# Patient Record
Sex: Female | Born: 1970 | Race: White | Hispanic: No | Marital: Married | Smoking: Never smoker
Health system: Southern US, Community
[De-identification: ages and names within clinical notes are randomized; demographics above are authoritative.]

## PROBLEM LIST (undated history)

## (undated) DIAGNOSIS — F419 Anxiety disorder, unspecified: Secondary | ICD-10-CM

## (undated) DIAGNOSIS — F329 Major depressive disorder, single episode, unspecified: Secondary | ICD-10-CM

## (undated) DIAGNOSIS — F319 Bipolar disorder, unspecified: Secondary | ICD-10-CM

## (undated) DIAGNOSIS — F32A Depression, unspecified: Secondary | ICD-10-CM

## (undated) DIAGNOSIS — F909 Attention-deficit hyperactivity disorder, unspecified type: Secondary | ICD-10-CM

## (undated) HISTORY — PX: HERNIA REPAIR: SHX51

## (undated) HISTORY — PX: KNEE ARTHROSCOPY: SHX127

## (undated) HISTORY — PX: WRIST SURGERY: SHX841

## (undated) HISTORY — DX: Bipolar disorder, unspecified: F31.9

## (undated) HISTORY — DX: Attention-deficit hyperactivity disorder, unspecified type: F90.9

## (undated) HISTORY — PX: KIDNEY SURGERY: SHX687

## (undated) HISTORY — DX: Anxiety disorder, unspecified: F41.9

## (undated) HISTORY — DX: Depression, unspecified: F32.A

## (undated) HISTORY — PX: ABDOMINAL HYSTERECTOMY: SHX81

## (undated) HISTORY — DX: Major depressive disorder, single episode, unspecified: F32.9

---

## 1998-05-15 ENCOUNTER — Other Ambulatory Visit: Admission: RE | Admit: 1998-05-15 | Discharge: 1998-05-15 | Payer: Self-pay | Admitting: Obstetrics and Gynecology

## 2000-08-08 ENCOUNTER — Other Ambulatory Visit: Admission: RE | Admit: 2000-08-08 | Discharge: 2000-08-08 | Payer: Self-pay | Admitting: Obstetrics and Gynecology

## 2005-06-18 ENCOUNTER — Other Ambulatory Visit: Payer: Self-pay

## 2005-06-18 ENCOUNTER — Emergency Department: Payer: Self-pay | Admitting: Emergency Medicine

## 2006-02-09 ENCOUNTER — Emergency Department: Payer: Self-pay | Admitting: Emergency Medicine

## 2006-12-22 ENCOUNTER — Emergency Department: Payer: Self-pay | Admitting: Emergency Medicine

## 2007-01-02 ENCOUNTER — Ambulatory Visit: Payer: Self-pay | Admitting: Urology

## 2007-01-16 ENCOUNTER — Ambulatory Visit: Payer: Self-pay | Admitting: Pain Medicine

## 2007-01-23 ENCOUNTER — Ambulatory Visit: Payer: Self-pay | Admitting: Pain Medicine

## 2007-01-24 ENCOUNTER — Ambulatory Visit: Payer: Self-pay | Admitting: Urology

## 2007-02-15 ENCOUNTER — Ambulatory Visit: Payer: Self-pay | Admitting: Pain Medicine

## 2007-02-23 ENCOUNTER — Ambulatory Visit: Payer: Self-pay | Admitting: Pain Medicine

## 2007-03-08 ENCOUNTER — Ambulatory Visit: Payer: Self-pay | Admitting: Physician Assistant

## 2007-03-14 ENCOUNTER — Ambulatory Visit: Payer: Self-pay | Admitting: Pain Medicine

## 2007-03-28 ENCOUNTER — Ambulatory Visit: Payer: Self-pay | Admitting: Pain Medicine

## 2007-04-12 ENCOUNTER — Ambulatory Visit: Payer: Self-pay | Admitting: Pain Medicine

## 2007-04-24 ENCOUNTER — Ambulatory Visit: Payer: Self-pay | Admitting: Physician Assistant

## 2007-05-17 ENCOUNTER — Encounter: Payer: Self-pay | Admitting: Pain Medicine

## 2007-05-23 ENCOUNTER — Ambulatory Visit: Payer: Self-pay | Admitting: Physician Assistant

## 2007-06-06 ENCOUNTER — Ambulatory Visit: Payer: Self-pay | Admitting: Physician Assistant

## 2007-06-16 ENCOUNTER — Encounter: Payer: Self-pay | Admitting: Pain Medicine

## 2007-07-17 ENCOUNTER — Encounter: Payer: Self-pay | Admitting: Pain Medicine

## 2007-08-09 ENCOUNTER — Ambulatory Visit: Payer: Self-pay | Admitting: Physician Assistant

## 2007-08-10 ENCOUNTER — Ambulatory Visit: Payer: Self-pay | Admitting: Pain Medicine

## 2007-08-21 ENCOUNTER — Other Ambulatory Visit: Payer: Self-pay

## 2007-08-21 ENCOUNTER — Emergency Department: Payer: Self-pay | Admitting: Emergency Medicine

## 2007-08-22 ENCOUNTER — Ambulatory Visit: Payer: Self-pay | Admitting: Emergency Medicine

## 2007-11-28 IMAGING — NM NM KIDNEY FUNCTION AND FLOW
2 series · 12 of 12 positions shown · non-contrast
Comparison: none

REASON FOR EXAM: Known hydronephrosis, checking kidney percentages only
COMMENTS:

[Series 1: renal (results) · 7.79mm/px · 6 of 60 frames shown]
[frame 6/60]
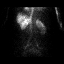
[frame 16/60]
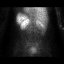
[frame 26/60]
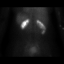
[frame 36/60]
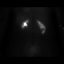
[frame 46/60]
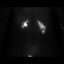
[frame 56/60]
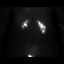

[Series 1: renal · 7.79mm/px · 6 of 60 frames shown]
[frame 6/60]
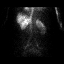
[frame 16/60]
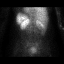
[frame 26/60]
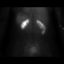
[frame 36/60]
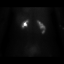
[frame 46/60]
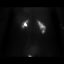
[frame 56/60]
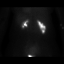

[12 of 12 positions shown; findings below may reference images not displayed]

PROCEDURE:     NM  - NM KIDNEY SCAN W  VASCULAR FLOW  - January 02, 2007  [DATE]

RESULT:     Following intravenous administration of 14.6 mCi of Technetium
99m MTPA, the renogram shows progressive accumulation of tracer activity in
the RIGHT kidney compatible with the patient's known history of
hydronephrosis.

Split renal function determination shows 53.5% function on the RIGHT and
46.5% function on the LEFT.
IMPRESSION: Please see above.

## 2008-01-22 ENCOUNTER — Ambulatory Visit: Payer: Self-pay | Admitting: Pain Medicine

## 2008-01-25 ENCOUNTER — Ambulatory Visit: Payer: Self-pay | Admitting: Pain Medicine

## 2008-02-07 ENCOUNTER — Ambulatory Visit: Payer: Self-pay | Admitting: Pain Medicine

## 2008-02-20 ENCOUNTER — Ambulatory Visit: Payer: Self-pay | Admitting: Pain Medicine

## 2008-05-27 ENCOUNTER — Inpatient Hospital Stay: Payer: Self-pay | Admitting: Unknown Physician Specialty

## 2008-09-02 ENCOUNTER — Ambulatory Visit: Payer: Self-pay | Admitting: Pain Medicine

## 2008-09-05 ENCOUNTER — Ambulatory Visit: Payer: Self-pay | Admitting: Pain Medicine

## 2008-09-30 ENCOUNTER — Ambulatory Visit: Payer: Self-pay | Admitting: Physician Assistant

## 2008-10-01 ENCOUNTER — Ambulatory Visit: Payer: Self-pay | Admitting: Pain Medicine

## 2008-10-15 ENCOUNTER — Ambulatory Visit: Payer: Self-pay | Admitting: Physician Assistant

## 2008-10-22 ENCOUNTER — Ambulatory Visit: Payer: Self-pay | Admitting: Pain Medicine

## 2008-11-27 ENCOUNTER — Ambulatory Visit: Payer: Self-pay | Admitting: Physician Assistant

## 2008-12-03 ENCOUNTER — Ambulatory Visit: Payer: Self-pay | Admitting: Pain Medicine

## 2008-12-31 ENCOUNTER — Emergency Department: Payer: Self-pay | Admitting: Emergency Medicine

## 2009-03-10 ENCOUNTER — Ambulatory Visit: Payer: Self-pay | Admitting: Physician Assistant

## 2009-03-20 ENCOUNTER — Ambulatory Visit: Payer: Self-pay | Admitting: Pain Medicine

## 2009-04-22 ENCOUNTER — Ambulatory Visit: Payer: Self-pay | Admitting: Physician Assistant

## 2009-05-09 ENCOUNTER — Ambulatory Visit: Payer: Self-pay | Admitting: Pain Medicine

## 2009-06-10 ENCOUNTER — Ambulatory Visit: Payer: Self-pay | Admitting: Physician Assistant

## 2009-07-01 ENCOUNTER — Ambulatory Visit: Payer: Self-pay | Admitting: Pain Medicine

## 2009-07-16 ENCOUNTER — Ambulatory Visit: Payer: Self-pay | Admitting: Physician Assistant

## 2009-09-11 ENCOUNTER — Ambulatory Visit: Payer: Self-pay | Admitting: Physician Assistant

## 2009-09-15 ENCOUNTER — Emergency Department: Payer: Self-pay | Admitting: Emergency Medicine

## 2009-10-08 ENCOUNTER — Ambulatory Visit: Payer: Self-pay | Admitting: Physician Assistant

## 2009-12-02 ENCOUNTER — Ambulatory Visit: Payer: Self-pay | Admitting: Pain Medicine

## 2009-12-24 ENCOUNTER — Ambulatory Visit: Payer: Self-pay | Admitting: Pain Medicine

## 2010-12-23 ENCOUNTER — Ambulatory Visit: Payer: Self-pay | Admitting: Pain Medicine

## 2010-12-31 ENCOUNTER — Ambulatory Visit: Payer: Self-pay | Admitting: Pain Medicine

## 2011-05-25 DIAGNOSIS — S5290XA Unspecified fracture of unspecified forearm, initial encounter for closed fracture: Secondary | ICD-10-CM | POA: Insufficient documentation

## 2011-06-14 DIAGNOSIS — D4819 Other specified neoplasm of uncertain behavior of connective and other soft tissue: Secondary | ICD-10-CM | POA: Insufficient documentation

## 2011-06-14 DIAGNOSIS — D481 Neoplasm of uncertain behavior of connective and other soft tissue: Secondary | ICD-10-CM | POA: Insufficient documentation

## 2011-06-17 DIAGNOSIS — C44701 Unspecified malignant neoplasm of skin of unspecified lower limb, including hip: Secondary | ICD-10-CM | POA: Insufficient documentation

## 2012-03-13 ENCOUNTER — Emergency Department: Payer: Self-pay | Admitting: Emergency Medicine

## 2012-03-13 LAB — URINALYSIS, COMPLETE
Glucose,UR: NEGATIVE mg/dL (ref 0–75)
Nitrite: NEGATIVE
Ph: 5 (ref 4.5–8.0)
Specific Gravity: 1.023 (ref 1.003–1.030)

## 2012-03-13 LAB — CBC
HCT: 39.5 % (ref 35.0–47.0)
MCH: 31.5 pg (ref 26.0–34.0)
MCHC: 34.1 g/dL (ref 32.0–36.0)
MCV: 92 fL (ref 80–100)
Platelet: 263 10*3/uL (ref 150–440)
RDW: 12.8 % (ref 11.5–14.5)
WBC: 5.5 10*3/uL (ref 3.6–11.0)

## 2012-03-13 LAB — BASIC METABOLIC PANEL
Anion Gap: 6 — ABNORMAL LOW (ref 7–16)
BUN: 8 mg/dL (ref 7–18)
Calcium, Total: 8.7 mg/dL (ref 8.5–10.1)
Chloride: 108 mmol/L — ABNORMAL HIGH (ref 98–107)
Co2: 27 mmol/L (ref 21–32)
EGFR (Non-African Amer.): >60
Glucose: 98 mg/dL (ref 65–99)
Osmolality: 280 (ref 275–301)
Sodium: 141 mmol/L (ref 136–145)

## 2012-03-15 LAB — URINE CULTURE

## 2012-03-17 DIAGNOSIS — Z85828 Personal history of other malignant neoplasm of skin: Secondary | ICD-10-CM | POA: Insufficient documentation

## 2013-02-27 DIAGNOSIS — Z01419 Encounter for gynecological examination (general) (routine) without abnormal findings: Secondary | ICD-10-CM | POA: Insufficient documentation

## 2013-03-01 DIAGNOSIS — N939 Abnormal uterine and vaginal bleeding, unspecified: Secondary | ICD-10-CM | POA: Insufficient documentation

## 2013-03-01 DIAGNOSIS — R102 Pelvic and perineal pain: Secondary | ICD-10-CM | POA: Insufficient documentation

## 2013-03-28 DIAGNOSIS — F419 Anxiety disorder, unspecified: Secondary | ICD-10-CM | POA: Insufficient documentation

## 2013-07-05 ENCOUNTER — Ambulatory Visit: Payer: Self-pay | Admitting: General Practice

## 2014-07-02 DIAGNOSIS — F329 Major depressive disorder, single episode, unspecified: Secondary | ICD-10-CM | POA: Insufficient documentation

## 2014-07-02 DIAGNOSIS — Z9889 Other specified postprocedural states: Secondary | ICD-10-CM

## 2014-07-02 DIAGNOSIS — F32A Depression, unspecified: Secondary | ICD-10-CM | POA: Insufficient documentation

## 2014-07-02 DIAGNOSIS — G8921 Chronic pain due to trauma: Secondary | ICD-10-CM | POA: Insufficient documentation

## 2014-07-02 DIAGNOSIS — F319 Bipolar disorder, unspecified: Secondary | ICD-10-CM | POA: Insufficient documentation

## 2014-07-02 DIAGNOSIS — R112 Nausea with vomiting, unspecified: Secondary | ICD-10-CM | POA: Insufficient documentation

## 2014-07-02 DIAGNOSIS — F1911 Other psychoactive substance abuse, in remission: Secondary | ICD-10-CM | POA: Insufficient documentation

## 2014-07-22 DIAGNOSIS — K449 Diaphragmatic hernia without obstruction or gangrene: Secondary | ICD-10-CM | POA: Insufficient documentation

## 2015-01-15 DIAGNOSIS — M5136 Other intervertebral disc degeneration, lumbar region: Secondary | ICD-10-CM | POA: Insufficient documentation

## 2015-01-15 DIAGNOSIS — K911 Postgastric surgery syndromes: Secondary | ICD-10-CM | POA: Insufficient documentation

## 2015-01-25 ENCOUNTER — Emergency Department: Payer: Self-pay | Admitting: Emergency Medicine

## 2015-04-01 DIAGNOSIS — Z9889 Other specified postprocedural states: Secondary | ICD-10-CM | POA: Insufficient documentation

## 2015-05-06 DIAGNOSIS — G4700 Insomnia, unspecified: Secondary | ICD-10-CM | POA: Insufficient documentation

## 2015-10-02 DIAGNOSIS — F411 Generalized anxiety disorder: Secondary | ICD-10-CM | POA: Insufficient documentation

## 2015-12-10 ENCOUNTER — Ambulatory Visit: Payer: Self-pay

## 2015-12-10 ENCOUNTER — Encounter (INDEPENDENT_AMBULATORY_CARE_PROVIDER_SITE_OTHER): Payer: Self-pay

## 2016-07-15 DIAGNOSIS — R110 Nausea: Secondary | ICD-10-CM | POA: Insufficient documentation

## 2017-07-12 ENCOUNTER — Other Ambulatory Visit: Payer: Self-pay | Admitting: Family Medicine

## 2017-07-12 ENCOUNTER — Ambulatory Visit
Admission: RE | Admit: 2017-07-12 | Discharge: 2017-07-12 | Disposition: A | Discharge status: Home / Self Care | Payer: BLUE CROSS/BLUE SHIELD | Source: Ambulatory Visit | Attending: Family Medicine | Admitting: Family Medicine

## 2017-07-12 DIAGNOSIS — M545 Low back pain: Secondary | ICD-10-CM | POA: Diagnosis present

## 2017-08-02 DIAGNOSIS — F902 Attention-deficit hyperactivity disorder, combined type: Secondary | ICD-10-CM | POA: Insufficient documentation

## 2017-08-24 ENCOUNTER — Other Ambulatory Visit: Payer: Self-pay | Admitting: Neurosurgery

## 2017-08-24 DIAGNOSIS — M5442 Lumbago with sciatica, left side: Secondary | ICD-10-CM

## 2017-08-30 ENCOUNTER — Ambulatory Visit
Admission: RE | Admit: 2017-08-30 | Discharge: 2017-08-30 | Disposition: A | Discharge status: Home / Self Care | Payer: BLUE CROSS/BLUE SHIELD | Source: Ambulatory Visit | Attending: Neurosurgery | Admitting: Neurosurgery

## 2017-08-30 ENCOUNTER — Ambulatory Visit: Payer: Self-pay

## 2017-08-30 DIAGNOSIS — M5127 Other intervertebral disc displacement, lumbosacral region: Secondary | ICD-10-CM | POA: Insufficient documentation

## 2017-08-30 DIAGNOSIS — M5136 Other intervertebral disc degeneration, lumbar region: Secondary | ICD-10-CM | POA: Insufficient documentation

## 2017-08-30 DIAGNOSIS — M5442 Lumbago with sciatica, left side: Secondary | ICD-10-CM | POA: Insufficient documentation

## 2018-11-28 ENCOUNTER — Encounter: Payer: Self-pay | Admitting: Psychiatry

## 2018-11-28 ENCOUNTER — Ambulatory Visit (INDEPENDENT_AMBULATORY_CARE_PROVIDER_SITE_OTHER): Payer: 59 | Admitting: Psychiatry

## 2018-11-28 ENCOUNTER — Other Ambulatory Visit: Payer: Self-pay

## 2022-06-11 NOTE — Progress Notes (Unsigned)
BH MD/PA/NP OP Progress Note  06/11/2022 10:57 AM Amy Leblanc  MRN:  [id number]  Chief Complaint: No chief complaint on file.  HPI: *** Visit Diagnosis: No diagnosis found.  Past Psychiatric History: Please see initial evaluation for full details. I have reviewed the history. No updates at this time.     Past Medical History:  Past Medical History:  Diagnosis Date   ADHD (attention deficit hyperactivity disorder)    Anxiety    Bipolar disorder ([place])    Depression     Past Surgical History:  Procedure Laterality Date   ABDOMINAL HYSTERECTOMY     HERNIA REPAIR     KIDNEY SURGERY Right    KNEE ARTHROSCOPY Bilateral    WRIST SURGERY Left     Family Psychiatric History: Please see initial evaluation for full details. I have reviewed the history. No updates at this time.     Family History:  Family History  Problem Relation Age of Onset   Anxiety disorder Mother    Depression Mother     Social History:  Social History   Socioeconomic History   Marital status: Married    Spouse name: greg   Number of children: 1   Years of education: Not on file   Highest education level: Some college, no degree  Occupational History   Not on file  Tobacco Use   Smoking status: Never   Smokeless tobacco: Never  Vaping Use   Vaping Use: Never used  Substance and Sexual Activity   Alcohol use: Not Currently   Drug use: Never   Sexual activity: Yes  Other Topics Concern   Not on file  Social History Narrative   Not on file   Social Determinants of Health   Financial Resource Strain: Low Risk  (11/28/2018)   Overall Financial Resource Strain (CARDIA)    Difficulty of Paying Living Expenses: Not hard at all  Food Insecurity: No Food Insecurity (11/28/2018)   Hunger Vital Sign    Worried About Running Out of Food in the Last Year: Never true    Ran Out of Food in the Last Year: Never true  Transportation Needs: No Transportation Needs (11/28/2018)   PRAPARE - Armed forces logistics/support/administrative officer (Medical): No    Lack of Transportation (Non-Medical): No  Physical Activity: Sufficiently Active (11/28/2018)   Exercise Vital Sign    Days of Exercise per Week: 6 days    Minutes of Exercise per Session: 60 min  Stress: Stress Concern Present (11/28/2018)   [place]    Feeling of Stress : Rather much  Social Connections: Unknown (11/28/2018)   Social Connection and Isolation Panel [NHANES]    Frequency of Communication with Friends and Family: Not on file    Frequency of Social Gatherings with Friends and Family: Not on file    Attends Religious Services: Never    Active Member of Clubs or Organizations: No    Attends Archivist Meetings: Never    Marital Status: Married    Allergies:  Allergies  Allergen Reactions   Penicillins Hives, Itching and Swelling    And itching    Ciprofloxacin Itching   Morphine Sulfate Itching   Sulfa Antibiotics Itching and Nausea And Vomiting   Tape Itching and Other (See Comments)    Welts & skin peeling off    Metabolic Disorder Labs: No results found for: "HGBA1C", "MPG" No results found for: "PROLACTIN" No results found  for: "CHOL", "TRIG", "HDL", "CHOLHDL", "VLDL", "LDLCALC" No results found for: "TSH"  Therapeutic Level Labs: No results found for: "LITHIUM" No results found for: "VALPROATE" No results found for: "CBMZ"  Current Medications: Current Outpatient Medications  Medication Sig Dispense Refill   amitriptyline (ELAVIL) 50 MG tablet      busPIRone (BUSPAR) 10 MG tablet      citalopram (CELEXA) 40 MG tablet TK 1 T PO QPM     gabapentin (NEURONTIN) 300 MG capsule Take by mouth.     LamoTRIgine 300 MG TB24 24 hour tablet      lidocaine (XYLOCAINE) 2 % solution 15 mL by Mouth route every four (4) hours as needed.     lisdexamfetamine (VYVANSE) 50 MG capsule      OLANZapine (ZYPREXA) 5 MG tablet Take by mouth.      ondansetron (ZOFRAN-ODT) 8 MG disintegrating tablet Take by mouth.     No current facility-administered medications for this visit.     Musculoskeletal: Strength & Muscle Tone: Normal Gait & Station: normal Patient leans: N/A  Psychiatric Specialty Exam: Review of Systems  There were no vitals taken for this visit.There is no height or weight on file to calculate BMI.  General Appearance: {Appearance:22683}  Eye Contact:  {BHH EYE CONTACT:22684}  Speech:  Clear and Coherent  Volume:  Normal  Mood:  {BHH MOOD:22306}  Affect:  {Affect (PAA):22687}  Thought Process:  Coherent  Orientation:  Full (Time, Place, and Person)  Thought Content: Logical   Suicidal Thoughts:  {ST/HT (PAA):22692}  Homicidal Thoughts:  {ST/HT (PAA):22692}  Memory:  Immediate;   Good  Judgement:  {Judgement (PAA):22694}  Insight:  {Insight (PAA):22695}  Psychomotor Activity:  Normal  Concentration:  Concentration: Good and Attention Span: Good  Recall:  Good  Fund of Knowledge: Good  Language: Good  Akathisia:  No  Handed:  Right  AIMS (if indicated): not done  Assets:  Communication Skills Desire for Improvement  ADL's:  Intact  Cognition: WNL  Sleep:  {BHH GOOD/FAIR/POOR:22877}   Screenings:   Assessment and Plan:  Amy Leblanc is a 51 y.o. year old female with a history of , who presents for follow up appointment for below.   Assessment  Plan   The patient demonstrates the following risk factors for suicide: Chronic risk factors for suicide include: {Chronic Risk Factors for ZOXWRUE:45409811}. Acute risk factors for suicide include: {Acute Risk Factors for BJYNWGN:56213086}. Protective factors for this patient include: {Protective Factors for Suicide VHQI:69629528}. Considering these factors, the overall suicide risk at this point appears to be {Desc; low/moderate/high:110033}. Patient {ACTION; IS/IS UXL:24401027} appropriate for outpatient follow up.        Collaboration of Care:  Collaboration of Care: {BH OP Collaboration of Care:21014065}  Patient/Guardian was advised Release of Information must be obtained prior to any record release in order to collaborate their care with an outside provider. Patient/Guardian was advised if they have not already done so to contact the registration department to sign all necessary forms in order for [place] to release information regarding their care.   Consent: Patient/Guardian gives verbal consent for treatment and assignment of benefits for services provided during this visit. Patient/Guardian expressed understanding and agreed to proceed.    Norman Clay, MD 06/11/2022, 10:57 AM

## 2022-06-14 ENCOUNTER — Ambulatory Visit (INDEPENDENT_AMBULATORY_CARE_PROVIDER_SITE_OTHER): Payer: Self-pay | Admitting: Psychiatry

## 2022-06-14 ENCOUNTER — Encounter: Payer: Self-pay | Admitting: Psychiatry

## 2022-06-14 VITALS — BP 130/88 | HR 109 | Temp 98.1°F | Ht 66.0 in | Wt 145.8 lb

## 2022-06-14 DIAGNOSIS — F3130 Bipolar disorder, current episode depressed, mild or moderate severity, unspecified: Secondary | ICD-10-CM

## 2022-06-14 DIAGNOSIS — F411 Generalized anxiety disorder: Secondary | ICD-10-CM

## 2022-06-14 MED ORDER — LORAZEPAM 2 MG PO TABS: 2.0000 mg | ORAL_TABLET | Freq: Every day | ORAL | 0 refills | Status: DC | PRN

## 2022-06-14 MED ORDER — CARIPRAZINE HCL 1.5 MG PO CAPS: 1.5000 mg | ORAL_CAPSULE | Freq: Every day | ORAL | 1 refills | Status: DC

## 2022-06-14 NOTE — Progress Notes (Signed)
Psychiatric Initial Adult Assessment   Patient Identification: Amy Leblanc MRN:  [id number] Date of Evaluation:  06/14/2022 Referral Source: Donnie Coffin, MD  Chief Complaint:   Chief Complaint  Patient presents with   Establish Care   Visit Diagnosis:    ICD-10-CM   1. Bipolar affective disorder, current episode depressed, current episode severity unspecified ([place])  F31.30     2. GAD (generalized anxiety disorder)  F41.1       History of Present Illness:   Amy Leblanc is a 51 y.o. year old female with a history of bipolar disorder, adhd, insomnia, s/p gastric surgery, s/p nephrectomy , who is referred for depression.   She states that her PCP has managing her medication, and she thought she needs help in this.  She used to be seen by Dr. Nicolasa Ducking.  She reports frustration of her care. She was claimed that she was not taking medication consistently, and was advised to hire somebody when her father was injured to take care of herself.  She received a termination letter, which caused another stress.  She states that her mother was Parkinson disease will be in hospice care.  She reports great relationship with her parents growing up.  Her mother is on morphine, and has not been able to communicate with her.  It has been awful to see her this way.  She is also concerned about her father if he were to be by himself.  She states that lot has happened over the past year, including her father having prostate cancer, having burn after fall. She had nephrectomy. She also talks about her son, who has autism.  He is not good at processing this.  Although she is "normal" at work, she isolates herself when she is not at work.  She feels overwhelmed, having crying spells and having panic attacks.  She also tends to think about worst case scenario, which she refers to the history of having septic shock when she had a Nissen surgery.   Depression-she has depressive symptoms as in PHQ-9.  She feels tired.   She denies SI.   Anxiety-she feels like she is losing her control.  She has intense anxiety with palpitation.  She has racing thoughts, and crying spells.   Mania-she reports history of decreased need for sleep up to 3 days years ago.  She was feeling energized.  She was spending money she did not have.  Although she reports buying a lottery, she states that she is doing this to feel better this time.   Medication- lamotrigine 200 mg twice a day, Buspar 30 mg twice daily (she has been on these for many years), lorazepam 1 mg daily as needed for anxiety, amitriptyline 75 mg at night (for migraine)    Household: husband, son Marital status:married Number of children: 56 yo son, mildly autistic. 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 Nicolasa Ducking Psychiatry admission: twice voluntarily. In 2000 and in 2004 for depression, "manic bout" of spending money ,  writing checks,  Previous suicide attempt: denies  Past trials of medication: lithium, Depakote, lamotrigine, olanzapine, Abilify, Geodon, risperidone (nausea) History of violence:  denies   Previous Psychotropic Medications: Yes   Substance Abuse History in the last 12 months:  No.  Consequences of Substance Abuse: Negative  Past Medical History:  Past Medical History:  Diagnosis Date   ADHD (attention deficit hyperactivity disorder)    Anxiety    Bipolar disorder ([place])    Depression      Past Surgical History:  Procedure Laterality Date   ABDOMINAL HYSTERECTOMY     HERNIA REPAIR     KIDNEY SURGERY Right    KNEE ARTHROSCOPY Bilateral    WRIST SURGERY Left     Family Psychiatric History: as below  Family History:  Family History  Problem Relation Age of Onset   Anxiety disorder Mother    Depression Mother    Schizophrenia Maternal Grandmother     Social History:   Social History   Socioeconomic History   Marital status: Married    Spouse name: greg   Number of children: 1   Years of education: Not on file   Highest education level: Some college, no degree  Occupational History   Not on file  Tobacco Use   Smoking status: Never   Smokeless tobacco: Never  Vaping Use   Vaping Use: Never used  Substance and Sexual Activity   Alcohol use: Not Currently   Drug use: Never   Sexual activity: Yes  Other Topics Concern   Not on file  Social History Narrative   Not on file   Social Determinants of Health   Financial Resource Strain: Low Risk  (11/28/2018)   Overall Financial Resource Strain (CARDIA)    Difficulty of Paying Living Expenses: Not hard at all  Food Insecurity: No Food Insecurity (11/28/2018)   Hunger Vital Sign    Worried About Running Out of Food in the Last Year: Never true    Ran Out of Food in the Last Year: Never true  Transportation Needs: No Transportation Needs (11/28/2018)   PRAPARE - Hydrologist (Medical): No    Lack of Transportation (Non-Medical): No  Physical Activity: Sufficiently Active (11/28/2018)   Exercise Vital Sign    Days of Exercise per Week: 6 days    Minutes of Exercise per Session: 60 min  Stress: Stress Concern Present (11/28/2018)   [place]    Feeling of Stress : Rather much  Social Connections: Unknown (11/28/2018)   Social Connection and Isolation Panel [NHANES]    Frequency of Communication with Friends and  Family: Not on file    Frequency of Social Gatherings with Friends and Family: Not on file    Attends Religious Services: Never    Marine scientist or Organizations: No    Attends Archivist Meetings: Never    Marital Status: Married    Additional Social History: as above  Allergies:   Allergies  Allergen Reactions   Penicillins Hives, Itching and Swelling    And itching    Ciprofloxacin Itching   Morphine Sulfate Itching   Sulfa Antibiotics Itching and Nausea And Vomiting   Tape Itching and Other (See Comments)    Welts & skin peeling off Welts & skin peeling off    Metabolic Disorder Labs: No results found for: "HGBA1C", "MPG" No results found for: "PROLACTIN" No results found for: "CHOL", "TRIG", "HDL", "  CHOLHDL", "VLDL", "LDLCALC" No results found for: "TSH"  Therapeutic Level Labs: No results found for: "LITHIUM" No results found for: "CBMZ" No results found for: "VALPROATE"  Current Medications: Current Outpatient Medications  Medication Sig Dispense Refill   amitriptyline (ELAVIL) 75 MG tablet Take by mouth.     busPIRone (BUSPAR) 30 MG tablet Take 30 mg by mouth 2 (two) times daily.     cariprazine (VRAYLAR) 1.5 MG capsule Take 1 capsule (1.5 mg total) by mouth daily. 30 capsule 1   citalopram (CELEXA) 40 MG tablet TK 1 T PO QPM     lamoTRIgine (LAMICTAL) 200 MG tablet Take 200 mg by mouth 2 (two) times daily.     LORazepam (ATIVAN) 2 MG tablet Take 1 tablet (2 mg total) by mouth daily as needed (extreme anxiety). 30 tablet 0   No current facility-administered medications for this visit.    Musculoskeletal: Strength & Muscle Tone: within normal limits Gait & Station: normal Patient leans: N/A  Psychiatric Specialty Exam: Review of Systems  Blood pressure 130/88, pulse (!) 109, temperature 98.1 F (36.7 C), temperature source Temporal, height '5\' 6"'$  (1.676 m), weight 145 lb 12.8 oz (66.1 kg).Body mass index is 23.53 kg/m.  General  Appearance: Fairly Groomed  Eye Contact:  Good  Speech:  Clear and Coherent  Volume:  Normal  Mood:  Depressed  Affect:  Appropriate, Congruent, and Restricted  Thought Process:  Coherent  Orientation:  Full (Time, Place, and Person)  Thought Content:  Logical  Suicidal Thoughts:  No  Homicidal Thoughts:  No  Memory:  Immediate;   Good  Judgement:  Good  Insight:  Good  Psychomotor Activity:  Normal  Concentration:  Concentration: Good and Attention Span: Good  Recall:  Good  Fund of Knowledge:Good  Language: Good  Akathisia:  No  Handed:  Right  AIMS (if indicated):  not done  Assets:  Communication Skills Desire for Improvement  ADL's:  Intact  Cognition: WNL  Sleep:  Poor   Screenings: GAD-7    Flowsheet Row Office Visit from 06/14/2022 in [place]  Total GAD-7 Score 21      PHQ2-9    [place] Office Visit from 06/14/2022 in [place]  PHQ-2 Total Score 4  PHQ-9 Total Score 14       Assessment and Plan:  Neyda Durango is a 51 y.o. year old female with a history of bipolar disorder, adhd, insomnia, s/p gastric surgery, s/p nephrectomy , who is referred for depression.   1. Bipolar affective disorder, current episode depressed, current episode severity unspecified ([place]) (r/o type II) 2. GAD (generalized anxiety disorder) She reports worsening in depressive symptoms and anxiety in the context of taking care of her mother who will be getting palliative care.  Other psychosocial stressors includes caring for her father, and her son with autism, who lives with the patient.  She reports good support from her husband.  Will add Vraylar to target bipolar depression.  Discussed potential metabolic side effect and EPS.  Will continue lamotrigine for bipolar disorder.  Will continue BuSpar for anxiety.  Will titrate clonazepam as needed for extreme anxiety; discussed potential risk of drowsiness and  oversedation, dependence and tolerance.   Plan (she will contact the office if she needs a refill) Start Vraylar 1.5 mg daily -[facility] Continue lamotrigine 200 mg twice a day Continue Buspar 30 mg twice daily Increase lorazepam 2 mg daily as needed for extreme anxiety - [facility] Next  appointment: 8/31 at 1 PM for 30 mins, in person   Collaboration of Care: Other N/A  Patient/Guardian was advised Release of Information must be obtained prior to any record release in order to collaborate their care with an outside provider. Patient/Guardian was advised if they have not already done so to contact the registration department to sign all necessary forms in order for [place] to release information regarding their care.   Consent: Patient/Guardian gives verbal consent for treatment and assignment of benefits for services provided during this visit. Patient/Guardian expressed understanding and agreed to proceed.   Norman Clay, MD 7/31/20234:42 PM

## 2022-06-14 NOTE — Patient Instructions (Signed)
Start Vraylar 1.5 mg daily Continue lamotrigine 200 mg twice a day Continue Buspar 30 mg twice daily  Increase lorazepam 2 mg daily as needed for extreme anxiety  Next appointment: 8/31 at 1 PM

## 2022-06-16 ENCOUNTER — Telehealth: Payer: Self-pay

## 2022-06-16 NOTE — Telephone Encounter (Signed)
tried to call patient back phone just keeps ringing no answer no message could be left.

## 2022-06-16 NOTE — Telephone Encounter (Signed)
pt called states she can not afford the vraylar she does not have insurance and even using discount card it still will cost her $850

## 2022-06-16 NOTE — Telephone Encounter (Signed)
Could you ask her if she is willing to try quetiapine 25 mg at night. Possible side effect is similar as Vraylar, and this medication can have cause more issues with weight gain/sleepiness. The hope is to use this medication for a short term.

## 2022-06-16 NOTE — Telephone Encounter (Signed)
Noted. It would be great if you could try tomorrow when you have time. Thanks.

## 2022-06-28 ENCOUNTER — Other Ambulatory Visit: Payer: Self-pay | Admitting: Psychiatry

## 2022-06-28 ENCOUNTER — Telehealth: Payer: Self-pay

## 2022-06-28 MED ORDER — BUSPIRONE HCL 30 MG PO TABS
30.0000 mg | ORAL_TABLET | Freq: Two times a day (BID) | ORAL | 0 refills | Status: DC
Start: 2022-06-28 — End: 2022-07-15

## 2022-06-28 MED ORDER — CITALOPRAM HYDROBROMIDE 40 MG PO TABS
40.0000 mg | ORAL_TABLET | Freq: Every day | ORAL | 0 refills | Status: DC
Start: 2022-06-28 — End: 2022-07-15

## 2022-06-28 MED ORDER — LAMOTRIGINE 200 MG PO TABS: 200.0000 mg | ORAL_TABLET | Freq: Two times a day (BID) | ORAL | 0 refills | Status: DC

## 2022-06-28 NOTE — Telephone Encounter (Signed)
pt called states that she needs refills on the amitriptyline, buspar, vraylar citalopram and lamotrigine

## 2022-06-28 NOTE — Telephone Encounter (Signed)
Will wait to hear back form her again.

## 2022-06-28 NOTE — Telephone Encounter (Signed)
Ordered buspar, citalopram and lamotrigine. I thought she could not afford Vraylar. Has she been taking this? Please advise her to ask refill of amitriptyline from other provider as I believe this is for headache. Thanks.

## 2022-06-28 NOTE — Telephone Encounter (Signed)
no answer no message could be left.  

## 2022-07-14 NOTE — Progress Notes (Unsigned)
BH MD/PA/NP OP Progress Note  07/15/2022 1:36 PM Amy Leblanc  MRN:  [id number]  Chief Complaint:  Chief Complaint  Patient presents with   Follow-up   HPI:  This is a follow-up appointment for bipolar disorder and anxiety.  She states that her mother passed away.  She feels the need to take care of her father, who is in 87's. She is not doing well.  She feels "meh," and has no desire to do anything.  She used to be active, and left to go shopping; she does not do this anymore.  She continues to go to work, although she has difficulty in concentration.   Her husband does not see who she used to be in her.  She has insomnia to hypersomnia.  She has significant fatigue.  She has fluctuation in appetite.  She denies SI.  She feels anxious and tense.  She has panic attacks a few times per week. She was taking two tabs of lorazepam around the loss of her mother, and she ran out of the medication 2 weeks ago.  She has alcohol use or drug use.  She was unable to afford Vraylar; she does not have insurance.  She is willing to try Latuda at this time.     Wt Readings from Last 3 Encounters:  07/15/22 143 lb 12.8 oz (65.2 kg)  06/14/22 145 lb 12.8 oz (66.1 kg)  11/28/18 146 lb 9.6 oz (66.5 kg)     Household: husband, son Marital status:married Number of children: 30 yo son, mildly autistic. 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 Bipolar affective disorder, current episode depressed, current episode severity unspecified ([place])  F31.30     2. GAD (generalized anxiety disorder)  F41.1       Past Psychiatric History: Please see initial evaluation for full details. I have reviewed the history. No updates at this time.     Past Medical History:  Past Medical History:  Diagnosis Date   ADHD (attention deficit hyperactivity disorder)    Anxiety    Bipolar  disorder ([place])    Depression     Past Surgical History:  Procedure Laterality Date   ABDOMINAL HYSTERECTOMY     HERNIA REPAIR     KIDNEY SURGERY Right    KNEE ARTHROSCOPY Bilateral    WRIST SURGERY Left     Family Psychiatric History: Please see initial evaluation for full details. I have reviewed the history. No updates at this time.     Family History:  Family History  Problem Relation Age of Onset   Anxiety disorder Mother    Depression Mother    Schizophrenia Maternal Grandmother     Social History:  Social History   Socioeconomic History   Marital status: Married    Spouse name: greg   Number of children: 1   Years of education: Not on file   Highest education level: Some college, no degree  Occupational History   Not on file  Tobacco Use   Smoking status: Never   Smokeless tobacco: Never  Vaping Use   Vaping Use: Never used  Substance and Sexual Activity   Alcohol use: Not Currently   Drug use: Never   Sexual activity: Yes  Other Topics Concern   Not on file  Social History Narrative   Not on file   Social Determinants  of Health   Financial Resource Strain: Low Risk  (11/28/2018)   Overall Financial Resource Strain (CARDIA)    Difficulty of Paying Living Expenses: Not hard at all  Food Insecurity: No Food Insecurity (11/28/2018)   Hunger Vital Sign    Worried About Running Out of Food in the Last Year: Never true    Ran Out of Food in the Last Year: Never true  Transportation Needs: No Transportation Needs (11/28/2018)   PRAPARE - Hydrologist (Medical): No    Lack of Transportation (Non-Medical): No  Physical Activity: Sufficiently Active (11/28/2018)   Exercise Vital Sign    Days of Exercise per Week: 6 days    Minutes of Exercise per Session: 60 min  Stress: Stress Concern Present (11/28/2018)   [place]    Feeling of Stress : Rather much  Social  Connections: Unknown (11/28/2018)   Social Connection and Isolation Panel [NHANES]    Frequency of Communication with Friends and Family: Not on file    Frequency of Social Gatherings with Friends and Family: Not on file    Attends Religious Services: Never    Active Member of Clubs or Organizations: No    Attends Archivist Meetings: Never    Marital Status: Married    Allergies:  Allergies  Allergen Reactions   Penicillins Hives, Itching and Swelling    And itching    Ciprofloxacin Itching   Morphine Sulfate Itching   Sulfa Antibiotics Itching and Nausea And Vomiting   Tape Itching and Other (See Comments)    Welts & skin peeling off Welts & skin peeling off    Metabolic Disorder Labs: No results found for: "HGBA1C", "MPG" No results found for: "PROLACTIN" No results found for: "CHOL", "TRIG", "HDL", "CHOLHDL", "VLDL", "LDLCALC" No results found for: "TSH"  Therapeutic Level Labs: No results found for: "LITHIUM" No results found for: "VALPROATE" No results found for: "CBMZ"  Current Medications: Current Outpatient Medications  Medication Sig Dispense Refill   amitriptyline (ELAVIL) 100 MG tablet Take 100 mg by mouth at bedtime.     lurasidone (LATUDA) 20 MG TABS tablet Take 1 tablet (20 mg total) by mouth daily. 30 tablet 0   [START ON 07/29/2022] busPIRone (BUSPAR) 30 MG tablet Take 1 tablet (30 mg total) by mouth 2 (two) times daily. 60 tablet 5   [START ON 07/29/2022] citalopram (CELEXA) 40 MG tablet Take 1 tablet (40 mg total) by mouth daily. 30 tablet 5   [START ON 07/29/2022] lamoTRIgine (LAMICTAL) 200 MG tablet Take 1 tablet (200 mg total) by mouth 2 (two) times daily. 60 tablet 2   LORazepam (ATIVAN) 2 MG tablet Take 1 tablet (2 mg total) by mouth daily as needed (extreme anxiety). 30 tablet 1   No current facility-administered medications for this visit.     Musculoskeletal: Strength & Muscle Tone: within normal limits Gait & Station:  normal Patient leans: N/A  Psychiatric Specialty Exam: Review of Systems  Psychiatric/Behavioral:  Positive for decreased concentration, dysphoric mood and sleep disturbance. Negative for agitation, behavioral problems, confusion, hallucinations, self-injury and suicidal ideas. The patient is nervous/anxious. The patient is not hyperactive.   All other systems reviewed and are negative.   Blood pressure 121/78, pulse (!) 108, temperature 97.8 F (36.6 C), temperature source Temporal, weight 143 lb 12.8 oz (65.2 kg).Body mass index is 23.21 kg/m.  General Appearance: Fairly Groomed  Eye Contact:  Fair  Speech:  Clear and Coherent  Volume:  Normal  Mood:  Anxious  Affect:  Appropriate, Congruent, and Tearful  Thought Process:  Coherent  Orientation:  Full (Time, Place, and Person)  Thought Content: Logical   Suicidal Thoughts:  No  Homicidal Thoughts:  No  Memory:  Immediate;   Good  Judgement:  Good  Insight:  Good  Psychomotor Activity:  Normal  Concentration:  Concentration: Good and Attention Span: Good  Recall:  Good  Fund of Knowledge: Good  Language: Good  Akathisia:  No  Handed:  Right  AIMS (if indicated): not done  Assets:  Communication Skills Desire for Improvement  ADL's:  Intact  Cognition: WNL  Sleep:  Poor   Screenings: GAD-7    Flowsheet Row Office Visit from 07/15/2022 in [place] Office Visit from 06/14/2022 in [facility]  Total GAD-7 Score 11 21      PHQ2-9    [place] Visit from 07/15/2022 in [place] Office Visit from 06/14/2022 in [place]  PHQ-2 Total Score 2 4  PHQ-9 Total Score 7 14        Assessment and Plan:  Amy Leblanc is a 51 y.o. year old female with a history of bipolar disorder, ADHD, insomnia, s/p gastric surgery, s/p nephrectomy, who presents for follow up appointment for below.    1.  Bipolar affective disorder, current episode depressed, current episode severity unspecified ([place]) (r/o type II) 2. GAD (generalized anxiety disorder) She reports worsening in depressive symptoms and then anxiety since loss of her mother, who is in palliative care. Other psychosocial stressors includes caring for her father, and her son with autism, who lives with the patient.  She reports good support from her husband.  She could not afford Vraylar (she does not have insurance).  Will start on Latuda to target bipolar depression.  Discussed potential metabolic side effect, EPS.  Will continue lamotrigine for bipolar depression.  Will continue BuSpar for anxiety.  Will continue lorazepam as needed for anxiety.  It would discussed with the patient that this medication will not be filled sooner if she were to run it out.   Plan  Start latuda 20 mg daily  -charles drew Continue lamotrigine 200 mg twice a day Continue Buspar 30 mg twice daily Continue lorazepam 2 mg daily as needed for extreme anxiety - [facility] Next appointment: 10/26 at 1 PM for 30 mins, in person - on amitriptyline 100 mg at night for migraine  Past trials of medication: lithium, Depakote, lamotrigine, olanzapine, Abilify, Geodon, quetiapine (weight gain),  risperidone (nausea)   The patient demonstrates the following risk factors for suicide: Chronic risk factors for suicide include: The. Acute risk factors for suicide include: loss (financial, interpersonal, professional). Protective factors for this patient include: positive social support and hope for the future. Considering these factors, the overall suicide risk at this point appears to be low. Patient is appropriate for outpatient follow up.   Collaboration of Care: Collaboration of Care: Other N/A  Patient/Guardian was advised Release of Information must be obtained prior to any record release in order to collaborate their care with an outside provider. Patient/Guardian was  advised if they have not already done so to contact the registration department to sign all necessary forms in order for [place] to release information regarding their care.   Consent: Patient/Guardian gives verbal consent for treatment and assignment of benefits for services provided during this visit. Patient/Guardian expressed understanding and  agreed to proceed.    Norman Clay, MD 07/15/2022, 1:36 PM

## 2022-07-15 ENCOUNTER — Ambulatory Visit (INDEPENDENT_AMBULATORY_CARE_PROVIDER_SITE_OTHER): Payer: Self-pay | Admitting: Psychiatry

## 2022-07-15 ENCOUNTER — Encounter: Payer: Self-pay | Admitting: Psychiatry

## 2022-07-15 VITALS — BP 121/78 | HR 108 | Temp 97.8°F | Wt 143.8 lb

## 2022-07-15 DIAGNOSIS — F411 Generalized anxiety disorder: Secondary | ICD-10-CM

## 2022-07-15 DIAGNOSIS — F3130 Bipolar disorder, current episode depressed, mild or moderate severity, unspecified: Secondary | ICD-10-CM

## 2022-07-15 MED ORDER — CITALOPRAM HYDROBROMIDE 40 MG PO TABS: 40.0000 mg | ORAL_TABLET | Freq: Every day | ORAL | 5 refills | Status: DC

## 2022-07-15 MED ORDER — LORAZEPAM 2 MG PO TABS: 2.0000 mg | ORAL_TABLET | Freq: Every day | ORAL | 1 refills | Status: DC | PRN

## 2022-07-15 MED ORDER — LURASIDONE HCL 20 MG PO TABS: 20.0000 mg | ORAL_TABLET | Freq: Every day | ORAL | 0 refills | Status: DC

## 2022-07-15 MED ORDER — LAMOTRIGINE 200 MG PO TABS
200.0000 mg | ORAL_TABLET | Freq: Two times a day (BID) | ORAL | 2 refills | Status: DC
Start: 2022-07-29 — End: 2022-09-09

## 2022-07-15 MED ORDER — BUSPIRONE HCL 30 MG PO TABS
30.0000 mg | ORAL_TABLET | Freq: Two times a day (BID) | ORAL | 5 refills | Status: DC
Start: 2022-07-29 — End: 2022-12-30

## 2022-07-15 NOTE — Patient Instructions (Signed)
Start latuda 20 mg daily   Continue lamotrigine 200 mg twice a day Continue Buspar 30 mg twice daily Continue lorazepam 2 mg daily as needed for extreme anxiety  Next appointment: 10/26 at 1 PM

## 2022-08-17 ENCOUNTER — Telehealth: Payer: Self-pay

## 2022-08-17 ENCOUNTER — Other Ambulatory Visit: Payer: Self-pay | Admitting: Psychiatry

## 2022-08-17 MED ORDER — LURASIDONE HCL 20 MG PO TABS: 20.0000 mg | ORAL_TABLET | Freq: Every day | ORAL | 0 refills | Status: DC

## 2022-08-17 NOTE — Telephone Encounter (Signed)
received fax requesting a refill on the lurasidone hcl '20mg'$ .  pt last seenb on 7-31 next appt 10-26   lurasidone (LATUDA) 20 MG TABS tablet 30 tablet 0 07/15/2022 08/14/2022   Sig - Route: Take 1 tablet (20 mg total) by mouth daily. - Oral   Sent to pharmacy as: lurasidone (LATUDA) 20 MG Tab tablet   E-Prescribing Status: Receipt confirmed by pharmacy (07/15/2022  1:29 PM EDT)

## 2022-08-17 NOTE — Telephone Encounter (Signed)
Ordered

## 2022-08-18 NOTE — Telephone Encounter (Signed)
no answer no message could be left just keeps ringing.

## 2022-09-07 NOTE — Progress Notes (Unsigned)
BH MD/PA/NP OP Progress Note  09/09/2022 1:35 PM Amy Leblanc  MRN:  [id number]  Chief Complaint:  Chief Complaint  Patient presents with   Follow-up   HPI:  This is a follow-up appointment for bipolar disorder and then anxiety.  She states that she continues to miss her mother.  She has flashback of "bad thing," referring to her mother who was sick.  Although she was able to get Latuda, she has not noticed much difference.  Although she has been able to go to work, she feels drain and exhausted.  She has been able to do daily tasks. She reports feeling better after being normalized of her grief. She is willing to see a therapist. The patient has mood symptoms as in PHQ-9/GAD-7.  She has insomnia with intense anxiety. She takes lorazepam a few times per week. She denies SI, HI, AH, VH.  She denies decreased need for sleep or euphonia.  She denied alcohol use or drug use.   Bipolar-she was diagnosed with bipolar disorder years ago.  She was in the closet, although she does not recall the reason.  She reports history of doing "random crazy things" includes jumping of of car as a suicide attempt. She also had thought of jumping off from balcony with the thought that she could literally do anything.  She denies decreased need for sleep.    Wt Readings from Last 3 Encounters:  09/09/22 152 lb 12.8 oz (69.3 kg)  07/15/22 143 lb 12.8 oz (65.2 kg)  06/14/22 145 lb 12.8 oz (66.1 kg)     Visit Diagnosis:    ICD-10-CM   1. Bipolar affective disorder, current episode depressed, current episode severity unspecified ([place])  F31.30     2. GAD (generalized anxiety disorder)  F41.1       Past Psychiatric History: Please see initial evaluation for full details. I have reviewed the history. No updates at this time.     Past Medical History:  Past Medical History:  Diagnosis Date   ADHD (attention deficit hyperactivity disorder)    Anxiety    Bipolar disorder ([place])    Depression     Past  Surgical History:  Procedure Laterality Date   ABDOMINAL HYSTERECTOMY     HERNIA REPAIR     KIDNEY SURGERY Right    KNEE ARTHROSCOPY Bilateral    WRIST SURGERY Left     Family Psychiatric History: Please see initial evaluation for full details. I have reviewed the history. No updates at this time.     Family History:  Family History  Problem Relation Age of Onset   Anxiety disorder Mother    Depression Mother    Schizophrenia Maternal Grandmother     Social History:  Social History   Socioeconomic History   Marital status: Married    Spouse name: Amy Leblanc   Number of children: 1   Years of education: Not on file   Highest education level: Some college, no degree  Occupational History   Not on file  Tobacco Use   Smoking status: Never   Smokeless tobacco: Never  Vaping Use   Vaping Use: Never used  Substance and Sexual Activity   Alcohol use: Not Currently   Drug use: Never   Sexual activity: Yes  Other Topics Concern   Not on file  Social History Narrative   Not on file   Social Determinants of Health   Financial Resource Strain: Low Risk  (11/28/2018)   Overall Financial Resource Strain (CARDIA)  Difficulty of Paying Living Expenses: Not hard at all  Food Insecurity: No Food Insecurity (11/28/2018)   Hunger Vital Sign    Worried About Running Out of Food in the Last Year: Never true    Ran Out of Food in the Last Year: Never true  Transportation Needs: No Transportation Needs (11/28/2018)   PRAPARE - Hydrologist (Medical): No    Lack of Transportation (Non-Medical): No  Physical Activity: Sufficiently Active (11/28/2018)   Exercise Vital Sign    Days of Exercise per Week: 6 days    Minutes of Exercise per Session: 60 min  Stress: Stress Concern Present (11/28/2018)   [place]    Feeling of Stress : Rather much  Social Connections: Unknown (11/28/2018)   Social  Connection and Isolation Panel [NHANES]    Frequency of Communication with Friends and Family: Not on file    Frequency of Social Gatherings with Friends and Family: Not on file    Attends Religious Services: Never    Active Member of Clubs or Organizations: No    Attends Archivist Meetings: Never    Marital Status: Married    Allergies:  Allergies  Allergen Reactions   Penicillins Hives, Itching and Swelling    And itching    Ciprofloxacin Itching   Morphine Sulfate Itching   Sulfa Antibiotics Itching and Nausea And Vomiting   Tape Itching and Other (See Comments)    Welts & skin peeling off Welts & skin peeling off    Metabolic Disorder Labs: No results found for: "HGBA1C", "MPG" No results found for: "PROLACTIN" No results found for: "CHOL", "TRIG", "HDL", "CHOLHDL", "VLDL", "LDLCALC" No results found for: "TSH"  Therapeutic Level Labs: No results found for: "LITHIUM" No results found for: "VALPROATE" No results found for: "CBMZ"  Current Medications: Current Outpatient Medications  Medication Sig Dispense Refill   amitriptyline (ELAVIL) 100 MG tablet Take 100 mg by mouth at bedtime.     busPIRone (BUSPAR) 30 MG tablet Take 1 tablet (30 mg total) by mouth 2 (two) times daily. 60 tablet 5   citalopram (CELEXA) 40 MG tablet Take 1 tablet (40 mg total) by mouth daily. 30 tablet 5   lurasidone (LATUDA) 20 MG TABS tablet Take 1 tablet (20 mg total) by mouth daily. 30 tablet 0   lurasidone (LATUDA) 40 MG TABS tablet Take 1 tablet (40 mg total) by mouth daily with breakfast. 30 tablet 1   [START ON 10/28/2022] lamoTRIgine (LAMICTAL) 200 MG tablet Take 1 tablet (200 mg total) by mouth 2 (two) times daily. 60 tablet 2   [START ON 09/12/2022] LORazepam (ATIVAN) 2 MG tablet Take 1 tablet (2 mg total) by mouth daily as needed (extreme anxiety). 30 tablet 1   No current facility-administered medications for this visit.     Musculoskeletal: Strength & Muscle Tone:  within normal limits Gait & Station: normal Patient leans: N/A  Psychiatric Specialty Exam: Review of Systems  Blood pressure 124/85, pulse 87, temperature 98.3 F (36.8 C), temperature source Oral, height '5\' 6"'$  (1.676 m), weight 152 lb 12.8 oz (69.3 kg).Body mass index is 24.66 kg/m.  General Appearance: Fairly Groomed  Eye Contact:  Good  Speech:  Clear and Coherent  Volume:  Normal  Mood:  Anxious  Affect:  Appropriate, Congruent, and down  Thought Process:  Coherent  Orientation:  Full (Time, Place, and Person)  Thought Content: Logical   Suicidal Thoughts:  No  Homicidal Thoughts:  No  Memory:  Immediate;   Good  Judgement:  Good  Insight:  Good  Psychomotor Activity:  Normal, no rigidity, no tremors, no dyskinesia.   Concentration:  Concentration: Good and Attention Span: Good  Recall:  Good  Fund of Knowledge: Good  Language: Good  Akathisia:  No  Handed:  Right  AIMS (if indicated): not done  Assets:  Communication Skills Desire for Improvement  ADL's:  Intact  Cognition: WNL  Sleep:  Poor   Screenings: GAD-7    Flowsheet Row Office Visit from 09/09/2022 in [place] Office Visit from 07/15/2022 in [facility] Office Visit from 06/14/2022 in [place]  Total GAD-7 Score '12 11 21      '$ PHQ2-9    [place] Visit from 09/09/2022 in [place] Office Visit from 07/15/2022 in [place] Office Visit from 06/14/2022 in [place]  PHQ-2 Total Score '2 2 4  '$ PHQ-9 Total Score '10 7 14        '$ Assessment and Plan:  Amy Leblanc is a 51 y.o. year old female with a history of bipolar disorder, ADHD, insomnia, s/p gastric surgery, s/p nephrectomy, who presents for follow up appointment for below.   1. Bipolar affective disorder, current episode depressed, current episode severity  unspecified ([place]) (r/o type II) 2. GAD (generalized anxiety disorder) She continues to report depressive symptoms and anxiety in the setting of loss of her mother, who is in palliative care. Other psychosocial stressors includes caring for her father, and her son with autism, who lives with the patient.  She reports good support from her husband.  We uptitrate Latuda to optimize treatment for bipolar depression.  Potential metabolic side effect and EPS.  Noted that she has had weight gain since the last visit; will continue to monitor this.  Will continue lamotrigine for bipolar disorder.  Will continue BuSpar for anxiety.  Will continue lorazepam as needed for anxiety. She will greatly benefit from CBT; will make a referral when the therapist here is on board.    Plan  Increase Latuda 40 mg daily  -charles drew Continue lamotrigine 200 mg twice a day -charles drew Continue Buspar 30 mg twice daily -charles drew Continue lorazepam 2 mg daily as needed for extreme anxiety - [facility] Next appointment: 12/19 at 1 PM for 30 mins, in person - on amitriptyline 100 mg at night for migraine   Past trials of medication: lithium, Depakote, lamotrigine, olanzapine, Abilify, Geodon, quetiapine (weight gain),  risperidone (nausea)     The patient demonstrates the following risk factors for suicide: Chronic risk factors for suicide include: The. Acute risk factors for suicide include: loss (financial, interpersonal, professional). Protective factors for this patient include: positive social support and hope for the future. Considering these factors, the overall suicide risk at this point appears to be low. Patient is appropriate for outpatient follow up.         Collaboration of Care: Collaboration of Care: Other reviewed notes in Epic  Patient/Guardian was advised Release of Information must be obtained prior to any record release in order to collaborate their care with an outside provider.  Patient/Guardian was advised if they have not already done so to contact the registration department to sign all necessary forms in order for [place] to release information regarding their care.   Consent: Patient/Guardian gives verbal consent for treatment and assignment of benefits for services  provided during this visit. Patient/Guardian expressed understanding and agreed to proceed.    Norman Clay, MD 09/09/2022, 1:35 PM

## 2022-09-09 ENCOUNTER — Ambulatory Visit (INDEPENDENT_AMBULATORY_CARE_PROVIDER_SITE_OTHER): Payer: Self-pay | Admitting: Psychiatry

## 2022-09-09 ENCOUNTER — Encounter: Payer: Self-pay | Admitting: Psychiatry

## 2022-09-09 VITALS — BP 124/85 | HR 87 | Temp 98.3°F | Ht 66.0 in | Wt 152.8 lb

## 2022-09-09 DIAGNOSIS — F411 Generalized anxiety disorder: Secondary | ICD-10-CM

## 2022-09-09 DIAGNOSIS — F3130 Bipolar disorder, current episode depressed, mild or moderate severity, unspecified: Secondary | ICD-10-CM

## 2022-09-09 MED ORDER — LORAZEPAM 2 MG PO TABS: 2.0000 mg | ORAL_TABLET | Freq: Every day | ORAL | 1 refills | Status: DC | PRN

## 2022-09-09 MED ORDER — LURASIDONE HCL 40 MG PO TABS: 40.0000 mg | ORAL_TABLET | Freq: Every day | ORAL | 1 refills | Status: DC

## 2022-09-09 MED ORDER — LAMOTRIGINE 200 MG PO TABS: 200.0000 mg | ORAL_TABLET | Freq: Two times a day (BID) | ORAL | 2 refills | Status: DC

## 2022-09-09 NOTE — Patient Instructions (Signed)
Increase Latuda 40 mg daily   Continue lamotrigine 200 mg twice a day  Continue Buspar 30 mg twice daily  Continue lorazepam 2 mg daily as needed for extreme anxiety  Next appointment: 12/19 at 1 PM

## 2022-10-13 ENCOUNTER — Ambulatory Visit: Payer: 59 | Admitting: Licensed Clinical Social Worker

## 2022-10-14 ENCOUNTER — Ambulatory Visit: Payer: 59 | Admitting: Licensed Clinical Social Worker

## 2022-10-31 NOTE — Progress Notes (Unsigned)
BH MD/PA/NP OP Progress Note  11/02/2022 1:35 PM Amy Leblanc  MRN:  [id number]  Chief Complaint:  Chief Complaint  Patient presents with   Follow-up   HPI:  This is a follow-up appointment for depression and anxiety.  She states that she has been doing better.  She is in the grief process.  Although she feels sad, she is also trying to see the positive aspect of it.  She has waves of emotion. It can smacked her out of nowhere.  She states that her father is seeing his best friend. She cannot understand this. She feels like she does not have a place at home anymore.  She is also worried what would happen if she goes before her father does. She also does not want to feel the same way (as she lost her mother).  She reports good support from her husband.  She enjoys coming to work.  However, she does not want to be on with many people.  She feels judged. The patient has mood symptoms as in PHQ-9/GAD-7. She sleeps 7 hours. She denies SI.  She denies decreased need for sleep or euphonia.  She denies alcohol use or drug use.  She discontinued Latuda due to weight gain.  She tends to eat to feel better.  She also has limited physical activity.  She is willing to try bupropion at this time.   Household: husband, son Marital status:married Number of children: 53 yo son, mildly autistic. 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 Bipolar affective disorder, current episode depressed, current episode severity unspecified ([place])  F31.30     2. GAD (generalized anxiety disorder)  F41.1       Past Psychiatric History: Please see initial evaluation for full details. I have reviewed the history. No updates at this time.     Past  Medical History:  Past Medical History:  Diagnosis Date   ADHD (attention deficit hyperactivity disorder)    Anxiety    Bipolar disorder ([place])    Depression     Past Surgical History:  Procedure Laterality Date   ABDOMINAL HYSTERECTOMY     HERNIA REPAIR     KIDNEY SURGERY Right    KNEE ARTHROSCOPY Bilateral    WRIST SURGERY Left     Family Psychiatric History: Please see initial evaluation for full details. I have reviewed the history. No updates at this time.     Family History:  Family History  Problem Relation Age of Onset   Anxiety disorder Mother    Depression Mother    Schizophrenia Maternal Grandmother     Social History:  Social History   Socioeconomic History   Marital status: Married    Spouse name: greg   Number of children: 1   Years of education: Not on file   Highest education level: Some college, no degree  Occupational History   Not on file  Tobacco Use   Smoking status: Never   Smokeless tobacco: Never  Vaping Use   Vaping Use: Never used  Substance and Sexual Activity   Alcohol use: Not Currently   Drug use: Never   Sexual activity:  Yes  Other Topics Concern   Not on file  Social History Narrative   Not on file   Social Determinants of Health   Financial Resource Strain: Low Risk  (11/28/2018)   Overall Financial Resource Strain (CARDIA)    Difficulty of Paying Living Expenses: Not hard at all  Food Insecurity: No Food Insecurity (11/28/2018)   Hunger Vital Sign    Worried About Running Out of Food in the Last Year: Never true    Ran Out of Food in the Last Year: Never true  Transportation Needs: No Transportation Needs (11/28/2018)   PRAPARE - Hydrologist (Medical): No    Lack of Transportation (Non-Medical): No  Physical Activity: Sufficiently Active (11/28/2018)   Exercise Vital Sign    Days of Exercise per Week: 6 days    Minutes of Exercise per Session: 60 min  Stress: Stress Concern Present  (11/28/2018)   [place]    Feeling of Stress : Rather much  Social Connections: Unknown (11/28/2018)   Social Connection and Isolation Panel [NHANES]    Frequency of Communication with Friends and Family: Not on file    Frequency of Social Gatherings with Friends and Family: Not on file    Attends Religious Services: Never    Active Member of Clubs or Organizations: No    Attends Archivist Meetings: Never    Marital Status: Married    Allergies:  Allergies  Allergen Reactions   Penicillins Hives, Itching and Swelling    And itching    Ciprofloxacin Itching   Morphine Sulfate Itching   Sulfa Antibiotics Itching and Nausea And Vomiting   Tape Itching and Other (See Comments)    Welts & skin peeling off Welts & skin peeling off    Metabolic Disorder Labs: No results found for: "HGBA1C", "MPG" No results found for: "PROLACTIN" No results found for: "CHOL", "TRIG", "HDL", "CHOLHDL", "VLDL", "LDLCALC" No results found for: "TSH"  Therapeutic Level Labs: No results found for: "LITHIUM" No results found for: "VALPROATE" No results found for: "CBMZ"  Current Medications: Current Outpatient Medications  Medication Sig Dispense Refill   amitriptyline (ELAVIL) 100 MG tablet Take 100 mg by mouth at bedtime.     buPROPion (WELLBUTRIN XL) 150 MG 24 hr tablet Take 1 tablet (150 mg total) by mouth daily. 30 tablet 1   busPIRone (BUSPAR) 30 MG tablet Take 1 tablet (30 mg total) by mouth 2 (two) times daily. 60 tablet 5   citalopram (CELEXA) 40 MG tablet Take 1 tablet (40 mg total) by mouth daily. 30 tablet 5   lamoTRIgine (LAMICTAL) 200 MG tablet Take 1 tablet (200 mg total) by mouth 2 (two) times daily. 60 tablet 2   [START ON 11/08/2022] LORazepam (ATIVAN) 2 MG tablet Take 1 tablet (2 mg total) by mouth daily as needed (extreme anxiety). 30 tablet 1   No current facility-administered medications for this  visit.     Musculoskeletal: Strength & Muscle Tone: within normal limits Gait & Station: normal Patient leans: N/A  Psychiatric Specialty Exam: Review of Systems  Psychiatric/Behavioral:  Positive for dysphoric mood. Negative for agitation, behavioral problems, confusion, decreased concentration, hallucinations, self-injury, sleep disturbance and suicidal ideas. The patient is nervous/anxious. The patient is not hyperactive.   All other systems reviewed and are negative.   Blood pressure 121/82, pulse 88, height '5\' 6"'$  (1.676 m), weight 159 lb (72.1 kg).Body mass index is 25.66 kg/m.  General Appearance: Fairly Groomed  Eye Contact:  Good  Speech:  Clear and Coherent  Volume:  Normal  Mood:   sad  Affect:  Appropriate, Congruent, and calm  Thought Process:  Coherent  Orientation:  Full (Time, Place, and Person)  Thought Content: Logical   Suicidal Thoughts:  No  Homicidal Thoughts:  No  Memory:  Immediate;   Good  Judgement:  Good  Insight:  Good  Psychomotor Activity:  Normal  Concentration:  Concentration: Good and Attention Span: Good  Recall:  Good  Fund of Knowledge: Good  Language: Good  Akathisia:  No  Handed:  Right  AIMS (if indicated): not done  Assets:  Communication Skills Desire for Improvement  ADL's:  Intact  Cognition: WNL  Sleep:  Fair   Screenings: GAD-7    [place] Office Visit from 11/02/2022 in [place] Visit from 09/09/2022 in [place] Office Visit from 07/15/2022 in [place] Office Visit from 06/14/2022 in [place]  Total GAD-7 Score '11 12 11 21      '$ PHQ2-9    [place] Office Visit from 11/02/2022 in [place] Office Visit from 09/09/2022 in [place] Office Visit from 07/15/2022 in [place] Office Visit from  06/14/2022 in [place]  PHQ-2 Total Score '2 2 2 4  '$ PHQ-9 Total Score '9 10 7 14        '$ Assessment and Plan:  Amy Leblanc is a 51 y.o. year old female with a history of bipolar disorder, ADHD, insomnia, s/p gastric surgery, s/p nephrectomy, who presents for follow up appointment for below.   1. Bipolar affective disorder, current episode depressed, current episode severity unspecified ([place]) (r/o type II) 2. GAD (generalized anxiety disorder) She reports depressive symptoms with anxiety since the last visit.  Psychosocial stressors includes grief of loss of her mother,  caring for her father, and her son with autism, who lives with the patient.  She reports good support from her husband, and enjoys her work.  She had adverse reaction of weight gain from [place].  Will start bupropion to target depression.  Discussed potential risk of medication induced mania.  She denies history of seizure.  Will continue lamotrigine for bipolar disorder.  Will continue BuSpar for anxiety.  Will continue lorazepam as needed for anxiety. She was referred for therapy at the previous visit; will follow this up at her next visit.    Plan  Continue lamotrigine 200 mg twice a day -charles drew Continue citalopram 40 mg daily  Start bupropion 150 mg daily Hold Latuda (she self discontinued) Continue Buspar 30 mg twice daily -charles drew Continue lorazepam 2 mg daily as needed for extreme anxiety - [facility] Next appointment: 2/15 at 1 PM for 30 mins, IP - on amitriptyline 100 mg at night for migraine   Past trials of medication: lithium, Depakote, lamotrigine, olanzapine, Abilify, Geodon, quetiapine (weight gain),  risperidone (nausea)     The patient demonstrates the following risk factors for suicide: Chronic risk factors for suicide include: The. Acute risk factors for suicide include: loss (financial, interpersonal, professional). Protective factors for this patient include:  positive social support and hope for the future. Considering these factors, the overall suicide risk at this point appears to be low. Patient is appropriate for outpatient follow up.           Collaboration of Care: Collaboration of Care: Other reviewed  notes in Epic  Patient/Guardian was advised Release of Information must be obtained prior to any record release in order to collaborate their care with an outside provider. Patient/Guardian was advised if they have not already done so to contact the registration department to sign all necessary forms in order for [place] to release information regarding their care.   Consent: Patient/Guardian gives verbal consent for treatment and assignment of benefits for services provided during this visit. Patient/Guardian expressed understanding and agreed to proceed.    Norman Clay, MD 11/02/2022, 1:35 PM

## 2022-11-02 ENCOUNTER — Ambulatory Visit (INDEPENDENT_AMBULATORY_CARE_PROVIDER_SITE_OTHER): Payer: Self-pay | Admitting: Psychiatry

## 2022-11-02 ENCOUNTER — Encounter: Payer: Self-pay | Admitting: Psychiatry

## 2022-11-02 VITALS — BP 121/82 | HR 88 | Ht 66.0 in | Wt 159.0 lb

## 2022-11-02 DIAGNOSIS — F411 Generalized anxiety disorder: Secondary | ICD-10-CM

## 2022-11-02 DIAGNOSIS — F3130 Bipolar disorder, current episode depressed, mild or moderate severity, unspecified: Secondary | ICD-10-CM

## 2022-11-02 MED ORDER — BUPROPION HCL ER (XL) 150 MG PO TB24: 150.0000 mg | ORAL_TABLET | Freq: Every day | ORAL | 1 refills | Status: DC

## 2022-11-02 MED ORDER — LORAZEPAM 2 MG PO TABS
2.0000 mg | ORAL_TABLET | Freq: Every day | ORAL | 1 refills | Status: DC | PRN
Start: 2022-11-08 — End: 2022-12-30

## 2022-11-02 NOTE — Patient Instructions (Signed)
Continue lamotrigine 200 mg twice a day  Continue citalopram 40 mg daily  Start bupropion 150 mg daily Hold Latuda (she self discontinued) Continue Buspar 30 mg twice daily  Continue lorazepam 2 mg daily as needed for extreme anxiety  Next appointment: 2/15 at 1 PM

## 2022-12-28 NOTE — Progress Notes (Unsigned)
BH MD/PA/NP OP Progress Note  12/30/2022 1:29 PM Amy Leblanc  MRN:  [id number]  Chief Complaint:  Chief Complaint  Patient presents with   Follow-up   HPI:  This is a follow-up appointment for bipolar disorder and anxiety.  She states that things has been difficult.  She is mourning.  She feels depressed.  She does not want to do anything.  She went to brunch with her friends the other time.  Although she states that she is a home body and an introverted, she feels tired of not doing.  She also feels anxious all the time, feeling overwhelmed with mourning. It has been difficult fr her to relax. She eats junk food, and has gained weight. She goes to work regularly, and reports good work environment. The patient has mood symptoms as in PHQ-9/GAD-7. She denies SI. She takes lorazepam 3-4 times per week for anxiety with limited benefit.  She denies decreased need for sleep or euphonia.  She rarely drinks alcohol.  She denies drug use.  She has not noticed any change since starting bupropion, and is willing to try higher dose at this time.    Wt Readings from Last 3 Encounters:  12/30/22 152 lb 6.4 oz (69.1 kg)  11/02/22 159 lb (72.1 kg)  09/09/22 152 lb 12.8 oz (69.3 kg)    06/14/22 145 lb 12.8 oz (66.1 kg)  11/28/18 146 lb 9.6 oz (66.5 kg)  135 lbs at baseline per patient  Household: husband, son Marital status:married Number of children: 62 yo son, mildly autistic. 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 Bipolar affective disorder, currently depressed, mild ([facility])  F31.31 TSH    CBC    Ferritin    2. GAD (generalized anxiety disorder)  F41.1       Past Psychiatric History: Please see initial evaluation for full details. I have reviewed the history. No updates at this time.     Past Medical History:  Past Medical History:  Diagnosis Date    ADHD (attention deficit hyperactivity disorder)    Anxiety    Bipolar disorder ([place])    Depression     Past Surgical History:  Procedure Laterality Date   ABDOMINAL HYSTERECTOMY     HERNIA REPAIR     KIDNEY SURGERY Right    KNEE ARTHROSCOPY Bilateral    WRIST SURGERY Left     Family Psychiatric History: Please see initial evaluation for full details. I have reviewed the history. No updates at this time.     Family History:  Family History  Problem Relation Age of Onset   Anxiety disorder Mother    Depression Mother    Schizophrenia Maternal Grandmother     Social History:  Social History   Socioeconomic History   Marital status: Married    Spouse name: Amy Leblanc   Number of children: 1   Years of education: Not on file   Highest education level: Some college, no degree  Occupational History   Not on file  Tobacco Use   Smoking status: Never   Smokeless tobacco: Never  Vaping Use   Vaping Use: Never used  Substance and Sexual Activity   Alcohol use: Not Currently   Drug use: Never   Sexual activity: Yes  Other Topics Concern   Not on file  Social History Narrative  Not on file   Social Determinants of Health   Financial Resource Strain: Low Risk  (11/28/2018)   Overall Financial Resource Strain (CARDIA)    Difficulty of Paying Living Expenses: Not hard at all  Food Insecurity: No Food Insecurity (11/28/2018)   Hunger Vital Sign    Worried About Running Out of Food in the Last Year: Never true    Ran Out of Food in the Last Year: Never true  Transportation Needs: No Transportation Needs (11/28/2018)   PRAPARE - Hydrologist (Medical): No    Lack of Transportation (Non-Medical): No  Physical Activity: Sufficiently Active (11/28/2018)   Exercise Vital Sign    Days of Exercise per Week: 6 days    Minutes of Exercise per Session: 60 min  Stress: Stress Concern Present (11/28/2018)   [place]    Feeling of Stress : Rather much  Social Connections: Unknown (11/28/2018)   Social Connection and Isolation Panel [NHANES]    Frequency of Communication with Friends and Family: Not on file    Frequency of Social Gatherings with Friends and Family: Not on file    Attends Religious Services: Never    Active Member of Clubs or Organizations: No    Attends Archivist Meetings: Never    Marital Status: Married    Allergies:  Allergies  Allergen Reactions   Penicillins Hives, Itching and Swelling    And itching    Ciprofloxacin Itching   Morphine Sulfate Itching   Sulfa Antibiotics Itching and Nausea And Vomiting   Tape Itching and Other (See Comments)    Welts & skin peeling off Welts & skin peeling off    Metabolic Disorder Labs: No results found for: "HGBA1C", "MPG" No results found for: "PROLACTIN" No results found for: "CHOL", "TRIG", "HDL", "CHOLHDL", "VLDL", "LDLCALC" No results found for: "TSH"  Therapeutic Level Labs: No results found for: "LITHIUM" No results found for: "VALPROATE" No results found for: "CBMZ"  Current Medications: Current Outpatient Medications  Medication Sig Dispense Refill   amitriptyline (ELAVIL) 100 MG tablet Take 100 mg by mouth at bedtime.     buPROPion (WELLBUTRIN XL) 150 MG 24 hr tablet Take 1 tablet (150 mg total) by mouth daily. 30 tablet 1   buPROPion (WELLBUTRIN XL) 300 MG 24 hr tablet Take 1 tablet (300 mg total) by mouth daily. 30 tablet 1   [START ON 01/25/2023] busPIRone (BUSPAR) 30 MG tablet Take 1 tablet (30 mg total) by mouth 2 (two) times daily. 60 tablet 5   [START ON 01/26/2023] citalopram (CELEXA) 40 MG tablet Take 1 tablet (40 mg total) by mouth daily. 30 tablet 5   [START ON 01/26/2023] lamoTRIgine (LAMICTAL) 200 MG tablet Take 1 tablet (200 mg total) by mouth 2 (two) times daily. 60 tablet 2   [START ON 02/08/2023] LORazepam (ATIVAN) 2 MG tablet Take 1 tablet (2 mg total) by mouth  daily as needed (extreme anxiety). 30 tablet 0   No current facility-administered medications for this visit.     Musculoskeletal: Strength & Muscle Tone: within normal limits Gait & Station: normal Patient leans: N/A  Psychiatric Specialty Exam: Review of Systems  Psychiatric/Behavioral:  Positive for dysphoric mood. Negative for agitation, behavioral problems, confusion, decreased concentration, hallucinations, self-injury, sleep disturbance and suicidal ideas. The patient is nervous/anxious. The patient is not hyperactive.   All other systems reviewed and are negative.   Blood pressure (!) 143/89, pulse 99,  temperature 97.9 F (36.6 C), temperature source Skin, height 5' 6"$  (1.676 m), weight 152 lb 6.4 oz (69.1 kg).Body mass index is 24.6 kg/m.  General Appearance: Fairly Groomed  Eye Contact:  Good  Speech:  Clear and Coherent  Volume:  Normal  Mood:   anxious  Affect:  Appropriate, Congruent, and down  Thought Process:  Coherent  Orientation:  Full (Time, Place, and Person)  Thought Content: Logical   Suicidal Thoughts:  No  Homicidal Thoughts:  No  Memory:  Immediate;   Good  Judgement:  Good  Insight:  Good  Psychomotor Activity:  Normal  Concentration:  Concentration: Good and Attention Span: Good  Recall:  Good  Fund of Knowledge: Good  Language: Good  Akathisia:  No  Handed:  Right  AIMS (if indicated): not done  Assets:  Communication Skills Desire for Improvement  ADL's:  Intact  Cognition: WNL  Sleep:  Fair   Screenings: GAD-7    Personnel officer Visit from 12/30/2022 in [place] Office Visit from 11/02/2022 in [place] Office Visit from 09/09/2022 in [place] Office Visit from 07/15/2022 in [place] Office Visit from 06/14/2022 in [place]  Total GAD-7 Score 12 11 12 11 21      $ PHQ2-9    [place] Office Visit from 12/30/2022 in [place] Office Visit from 11/02/2022 in [place] Office Visit from 09/09/2022 in [place] Office Visit from 07/15/2022 in [place] Office Visit from 06/14/2022 in [place]  PHQ-2 Total Score 2 2 2 2 4  $ PHQ-9 Total Score 6 9 10 7 14        $ Assessment and Plan:  Amy Leblanc is a 52 y.o. year old female with a history of bipolar disorder, ADHD, insomnia, s/p gastric surgery, s/p nephrectomy, who presents for follow up appointment for below.    1. Bipolar affective disorder, currently depressed, mild ([place]) 2. GAD (generalized anxiety disorder) (r/o type II) Acute stressors include:  Other stressors include: loss of her mother, her son with autism    History:  She continues to experience depressive symptoms and anxiety since the last visit.  She reports good support from her husband, and has been able to attend to work regularly.  Will uptitrate bupropion to optimize treatment for depression.  Will monitor her any side effect which includes medication induced mania.  She denies any history of seizure.  Will continue lamotrigine for bipolar disorder.  Will continue BuSpar and clonazepam as needed for anxiety.  She has had to see a therapist through [facility].  Will obtain labs to rule out medical health issues contributing to fatigue.    Plan  Continue lamotrigine 200 mg twice a day -charles drew Continue citalopram 40 mg daily  Increase bupropion 300 mg daily Continue Buspar 30 mg twice daily -charles drew Continue lorazepam 2 mg daily as needed for extreme anxiety - [facility] Obtain labs (TSH, CBC, ferritin) -[facility] Next appointment: 4/15 at 11:30 for 30 mins, IP - on  amitriptyline 100 mg at night for migraine   Past trials of medication: lithium, Depakote, lamotrigine, olanzapine, Abilify, Geodon, quetiapine (weight gain),  risperidone (nausea), Latuda (weight gain)     The patient demonstrates the following risk factors for suicide: Chronic risk  factors for suicide include: The. Acute risk factors for suicide include: loss (financial, interpersonal, professional). Protective factors for this patient include: positive social support and hope for the future. Considering these factors, the overall suicide risk at this point appears to be low. Patient is appropriate for outpatient follow up.       Collaboration of Care: Collaboration of Care: Other reviewed notes in epic  Patient/Guardian was advised Release of Information must be obtained prior to any record release in order to collaborate their care with an outside provider. Patient/Guardian was advised if they have not already done so to contact the registration department to sign all necessary forms in order for [place] to release information regarding their care.   Consent: Patient/Guardian gives verbal consent for treatment and assignment of benefits for services provided during this visit. Patient/Guardian expressed understanding and agreed to proceed.    Norman Clay, MD 12/30/2022, 1:29 PM

## 2022-12-30 ENCOUNTER — Ambulatory Visit (INDEPENDENT_AMBULATORY_CARE_PROVIDER_SITE_OTHER): Payer: 59 | Admitting: Psychiatry

## 2022-12-30 ENCOUNTER — Other Ambulatory Visit
Admission: RE | Admit: 2022-12-30 | Discharge: 2022-12-30 | Disposition: A | Discharge status: Home / Self Care | Payer: Self-pay | Attending: Psychiatry | Admitting: Psychiatry

## 2022-12-30 ENCOUNTER — Encounter: Payer: Self-pay | Admitting: Psychiatry

## 2022-12-30 ENCOUNTER — Other Ambulatory Visit: Payer: Self-pay | Admitting: Psychiatry

## 2022-12-30 VITALS — BP 143/89 | HR 99 | Temp 97.9°F | Ht 66.0 in | Wt 152.4 lb

## 2022-12-30 DIAGNOSIS — F3131 Bipolar disorder, current episode depressed, mild: Secondary | ICD-10-CM

## 2022-12-30 DIAGNOSIS — F411 Generalized anxiety disorder: Secondary | ICD-10-CM

## 2022-12-30 LAB — CBC
HCT: 38.4 % (ref 36.0–46.0)
Hemoglobin: 12.8 g/dL (ref 12.0–15.0)
MCH: 31 pg (ref 26.0–34.0)
MCHC: 33.3 g/dL (ref 30.0–36.0)
MCV: 93 fL (ref 80.0–100.0)
Platelets: 280 10*3/uL (ref 150–400)
RBC: 4.13 MIL/uL (ref 3.87–5.11)
RDW: 12.4 % (ref 11.5–15.5)
WBC: 6.1 10*3/uL (ref 4.0–10.5)
nRBC: 0 % (ref 0.0–0.2)

## 2022-12-30 LAB — FERRITIN: Ferritin: 32 ng/mL (ref 11–307)

## 2022-12-30 LAB — TSH: TSH: 2.271 u[IU]/mL (ref 0.350–4.500)

## 2022-12-30 MED ORDER — BUSPIRONE HCL 30 MG PO TABS: 30.0000 mg | ORAL_TABLET | Freq: Two times a day (BID) | ORAL | 5 refills | Status: DC

## 2022-12-30 MED ORDER — LORAZEPAM 2 MG PO TABS: 2.0000 mg | ORAL_TABLET | Freq: Every day | ORAL | 0 refills | Status: AC | PRN

## 2022-12-30 MED ORDER — CITALOPRAM HYDROBROMIDE 40 MG PO TABS: 40.0000 mg | ORAL_TABLET | Freq: Every day | ORAL | 5 refills | Status: DC

## 2022-12-30 MED ORDER — BUPROPION HCL ER (XL) 300 MG PO TB24: 300.0000 mg | ORAL_TABLET | Freq: Every day | ORAL | 1 refills | Status: DC

## 2022-12-30 MED ORDER — LAMOTRIGINE 200 MG PO TABS: 200.0000 mg | ORAL_TABLET | Freq: Two times a day (BID) | ORAL | 2 refills | Status: DC

## 2022-12-30 MED ORDER — LORAZEPAM 2 MG PO TABS: 2.0000 mg | ORAL_TABLET | Freq: Every day | ORAL | 0 refills | Status: DC | PRN

## 2022-12-30 NOTE — Patient Instructions (Signed)
Continue lamotrigine 200 mg twice a day  Continue citalopram 40 mg daily  Increase bupropion 300 mg daily Continue Buspar 30 mg twice daily  Continue lorazepam 2 mg daily as needed for extreme anxiety  Next appointment: 4/15 at 11:30

## 2022-12-30 NOTE — Telephone Encounter (Signed)
Could you cancel the order of lorazepam at [facility]? Thanks.

## 2022-12-31 NOTE — Telephone Encounter (Signed)
Pharmacy notified.

## 2022-12-31 NOTE — Telephone Encounter (Signed)
Pharmacy has been notified.

## 2023-02-08 ENCOUNTER — Other Ambulatory Visit: Payer: Self-pay | Admitting: Psychiatry

## 2023-02-15 ENCOUNTER — Telehealth: Payer: Self-pay | Admitting: Psychiatry

## 2023-02-15 NOTE — Telephone Encounter (Signed)
Noted, thanks!

## 2023-02-15 NOTE — Telephone Encounter (Signed)
Patient wanted you to be aware she was in the emergency department on Friday, March 29. You can see all the information in Epic and if you think you need to call her then that is fine, she stated.

## 2023-02-28 ENCOUNTER — Ambulatory Visit: Payer: Self-pay | Admitting: Psychiatry

## 2023-03-01 ENCOUNTER — Encounter (INDEPENDENT_AMBULATORY_CARE_PROVIDER_SITE_OTHER): Payer: Self-pay

## 2023-03-01 DIAGNOSIS — F411 Generalized anxiety disorder: Secondary | ICD-10-CM

## 2023-03-01 NOTE — Telephone Encounter (Signed)
Reviewed notes from ED on 02/11/2023. She presented with chest pain and dyspnea after running out of lorazepam two weeks prior. According to PDMP records, lorazepam was filled for 30 days on 2/26 and again on 3/29 for 8 tablets. Her HR was 138, and lab results, including troponin, were negative. She was discharged with a prescription for lorazepam.  I've spent a total time of 11 minutes providing service to this patient-generated inquiry in the MyChart message.

## 2023-03-02 MED ORDER — SERTRALINE HCL 50 MG PO TABS: ORAL_TABLET | ORAL | 0 refills | Status: DC

## 2023-03-02 MED ORDER — LORAZEPAM 2 MG PO TABS: 2.0000 mg | ORAL_TABLET | Freq: Every day | ORAL | 0 refills | Status: DC | PRN

## 2023-03-02 NOTE — Addendum Note (Signed)
Addended by: Neysa Hotter on: 03/02/2023 09:23 AM   Modules accepted: Orders

## 2023-03-07 NOTE — Progress Notes (Deleted)
BH MD/PA/NP OP Progress Note  03/07/2023 8:16 AM Amy Leblanc  MRN:  [id number]  Chief Complaint: No chief complaint on file.  HPI:  - lexapro was switched to sertraline with uptitration  May have used lorazepam more often    Visit Diagnosis: No diagnosis found.  Past Psychiatric History: Please see initial evaluation for full details. I have reviewed the history. No updates at this time.     Past Medical History:  Past Medical History:  Diagnosis Date   ADHD (attention deficit hyperactivity disorder)    Anxiety    Bipolar disorder (HCC)    Depression     Past Surgical History:  Procedure Laterality Date   ABDOMINAL HYSTERECTOMY     HERNIA REPAIR     KIDNEY SURGERY Right    KNEE ARTHROSCOPY Bilateral    WRIST SURGERY Left     Family Psychiatric History: Please see initial evaluation for full details. I have reviewed the history. No updates at this time.     Family History:  Family History  Problem Relation Age of Onset   Anxiety disorder Mother    Depression Mother    Schizophrenia Maternal Grandmother     Social History:  Social History   Socioeconomic History   Marital status: Married    Spouse name: Amy Leblanc   Number of children: 1   Years of education: Not on file   Highest education level: Some college, no degree  Occupational History   Not on file  Tobacco Use   Smoking status: Never   Smokeless tobacco: Never  Vaping Use   Vaping Use: Never used  Substance and Sexual Activity   Alcohol use: Not Currently   Drug use: Never   Sexual activity: Yes  Other Topics Concern   Not on file  Social History Narrative   Not on file   Social Determinants of Health   Financial Resource Strain: Low Risk  (11/28/2018)   Overall Financial Resource Strain (CARDIA)    Difficulty of Paying Living Expenses: Not hard at all  Food Insecurity: No Food Insecurity (11/28/2018)   Hunger Vital Sign    Worried About Running Out of Food in the Last Year: Never true     Ran Out of Food in the Last Year: Never true  Transportation Needs: No Transportation Needs (11/28/2018)   PRAPARE - Administrator, Civil Service (Medical): No    Lack of Transportation (Non-Medical): No  Physical Activity: Sufficiently Active (11/28/2018)   Exercise Vital Sign    Days of Exercise per Week: 6 days    Minutes of Exercise per Session: 60 min  Stress: Stress Concern Present (11/28/2018)   [employer] of Occupational Health - Occupational Stress Questionnaire    Feeling of Stress : Rather much  Social Connections: Unknown (11/28/2018)   Social Connection and Isolation Panel [NHANES]    Frequency of Communication with Friends and Family: Not on file    Frequency of Social Gatherings with Friends and Family: Not on file    Attends Religious Services: Never    Active Member of Clubs or Organizations: No    Attends Banker Meetings: Never    Marital Status: Married    Allergies:  Allergies  Allergen Reactions   Penicillins Hives, Itching and Swelling    And itching    Ciprofloxacin Itching   Morphine Sulfate Itching   Sulfa Antibiotics Itching and Nausea And Vomiting   Tape Itching and Other (See Comments)    Welts &  skin peeling off Welts & skin peeling off    Metabolic Disorder Labs: No results found for: "HGBA1C", "MPG" No results found for: "PROLACTIN" No results found for: "CHOL", "TRIG", "HDL", "CHOLHDL", "VLDL", "LDLCALC" Lab Results  Component Value Date   TSH 2.271 12/30/2022    Therapeutic Level Labs: No results found for: "LITHIUM" No results found for: "VALPROATE" No results found for: "CBMZ"  Current Medications: Current Outpatient Medications  Medication Sig Dispense Refill   amitriptyline (ELAVIL) 100 MG tablet Take 100 mg by mouth at bedtime.     buPROPion (WELLBUTRIN XL) 150 MG 24 hr tablet Take 1 tablet (150 mg total) by mouth daily. 30 tablet 1   buPROPion (WELLBUTRIN XL) 300 MG 24 hr tablet Take 1 tablet  (300 mg total) by mouth daily. 30 tablet 1   busPIRone (BUSPAR) 30 MG tablet Take 1 tablet (30 mg total) by mouth 2 (two) times daily. 60 tablet 5   lamoTRIgine (LAMICTAL) 200 MG tablet Take 1 tablet (200 mg total) by mouth 2 (two) times daily. 60 tablet 2   LORazepam (ATIVAN) 2 MG tablet Take 1 tablet (2 mg total) by mouth daily as needed (extreme anxiety). 30 tablet 0   LORazepam (ATIVAN) 2 MG tablet Take 1 tablet (2 mg total) by mouth daily as needed for anxiety. 30 tablet 0   sertraline (ZOLOFT) 50 MG tablet Take 0.5 tablets (25 mg total) by mouth at bedtime for 7 days, THEN 1 tablet (50 mg total) at bedtime for 7 days. 11 tablet 0   No current facility-administered medications for this visit.     Musculoskeletal: Strength & Muscle Tone:  N/A Gait & Station:  N/A Patient leans: N/A  Psychiatric Specialty Exam: Review of Systems  There were no vitals taken for this visit.There is no height or weight on file to calculate BMI.  General Appearance: {Appearance:22683}  Eye Contact:  {BHH EYE CONTACT:22684}  Speech:  Clear and Coherent  Volume:  Normal  Mood:  {BHH MOOD:22306}  Affect:  {Affect (PAA):22687}  Thought Process:  Coherent  Orientation:  Full (Time, Place, and Person)  Thought Content: Logical   Suicidal Thoughts:  {ST/HT (PAA):22692}  Homicidal Thoughts:  {ST/HT (PAA):22692}  Memory:  Immediate;   Good  Judgement:  {Judgement (PAA):22694}  Insight:  {Insight (PAA):22695}  Psychomotor Activity:  Normal  Concentration:  Concentration: Good and Attention Span: Good  Recall:  Good  Fund of Knowledge: Good  Language: Good  Akathisia:  No  Handed:  Right  AIMS (if indicated): not done  Assets:  Communication Skills Desire for Improvement  ADL's:  Intact  Cognition: WNL  Sleep:  {BHH GOOD/FAIR/POOR:22877}   Screenings: GAD-7    Loss adjuster, chartered Office Visit from 12/30/2022 in [facility] Office Visit from 11/02/2022 in [facility] Office Visit from 09/09/2022 in [facility] Office Visit from 07/15/2022 in [facility] Office Visit from 06[facility]14/2022 in [facility]  Total GAD-7 Score 12 11 12 11 21       PHQ2-9    Flowsheet Row Office Visit from 12/30/2022 in [facility] Office Visit from 11/02/2022 in [facility] Office Visit from 09/09/2022 in [facility] Office Visit from 07/15/2022 in [facility] Office Visit from 06/14/2022 in [facility]  PHQ-2 Total Score 2  PHQ-9 Total Score Assessment and Plan:  Amy Leblanc is a 52 y.o. year old female with a history of bipolar disorder, ADHD, insomnia, s/p gastric surgery, s/p nephrectomy, who presents for follow up appointment for below.    1. Bipolar affective disorder, currently depressed, mild (HCC) 2. GAD (generalized anxiety disorder) (r/o type II) Acute stressors include:  Other stressors include: loss of her mother, her son with autism    History:  She continues to experience depressive symptoms and anxiety since the last visit.  She reports good support from her husband, and has been able to attend to work regularly.  Will uptitrate bupropion to optimize treatment for depression.  Will monitor her any side effect which includes medication induced mania.  She denies any history of seizure.  Will continue lamotrigine for bipolar disorder.  Will continue BuSpar and clonazepam as needed for anxiety.  She has had to see a therapist through [facility].  Will obtain labs to rule out medical health issues contributing to fatigue.    Plan  Continue lamotrigine 200 mg twice a day -charles  drew Continue citalopram 40 mg daily  Increase bupropion 300 mg daily Continue Buspar 30 mg twice daily -charles drew Continue lorazepam 2 mg daily as needed for extreme anxiety - [facility] Obtain labs (TSH, CBC, ferritin) -[facility] Next appointment: 4/15 at 11:30 for 30 mins, IP - on amitriptyline 100 mg at night for migraine   Past trials of medication: lithium, Depakote, lamotrigine, olanzapine, Abilify, Geodon, quetiapine (weight gain),  risperidone (nausea), Latuda (weight gain)     The patient demonstrates the following risk factors for suicide: Chronic risk factors for suicide include: The. Acute risk factors for suicide include: loss (financial, interpersonal, professional). Protective factors for this patient include: positive social support and hope for the future. Considering these factors, the overall suicide risk at this point appears to be low. Patient is appropriate for outpatient follow up.     Collaboration of Care: Collaboration of Care: {BH OP Collaboration of Care:21014065}  Patient/Guardian was advised Release of Information must be obtained prior to any record release in order to collaborate their care with an outside provider. Patient/Guardian was advised if they have not already done so to contact the registration department to sign all necessary forms in order for [place] to release information regarding their care.   Consent: Patient/Guardian gives verbal consent for treatment and assignment of benefits for services provided during this visit. Patient/Guardian expressed understanding and agreed to proceed.    Neysa Hotter, MD 03/07/2023, 8:16 AM

## 2023-03-10 ENCOUNTER — Telehealth: Payer: Self-pay | Admitting: Psychiatry

## 2023-03-16 MED ORDER — SERTRALINE HCL 100 MG PO TABS: 100.0000 mg | ORAL_TABLET | Freq: Every day | ORAL | 0 refills | Status: DC

## 2023-03-17 NOTE — Progress Notes (Addendum)
Virtual Visit via Video Note  I connected with Amy Leblanc on 03/18/23 at  9:30 AM EDT by a video enabled telemedicine application and verified that I am speaking with the correct person using two identifiers.  Location: Patient: home Provider: office Persons participated in the visit- patient, provider    I discussed the limitations of evaluation and management by telemedicine and the availability of in person appointments. The patient expressed understanding and agreed to proceed.   I discussed the assessment and treatment plan with the patient. The patient was provided an opportunity to ask questions and all were answered. The patient agreed with the plan and demonstrated an understanding of the instructions.   The patient was advised to call back or seek an in-person evaluation if the symptoms worsen or if the condition fails to improve as anticipated.  I provided 20 minutes of non-face-to-face time during this encounter.   Neysa Hotter, MD    [facility] MD/PA/NP OP Progress Note  03/18/2023 10:07 AM Amy Leblanc  MRN:  [id number]  Chief Complaint:  Chief Complaint  Patient presents with   Follow-up   HPI:  - lexapro was switched to sertraline with uptitration  This is a follow-up appointment for bipolar disorder and anxiety.  She states that she has been just a mess and is on edge.  She feels jittery, nervous, and she has shortness of breath.  Although she goes to work regularly and denies any issues there, she does not want to leave the house otherwise.  She is withdrawn from people.  She used to enjoy get out and do things.  She still thinks about her mother.  She also thinks about her father, who is seeing another lady.  Although she feels thankful that he has someone, she cannot understand why this soon.  She is also concerned about his physical condition given elevated PSA.  She has occasional insomnia.  She denies SI.  She has just started on sertraline 100 mg, and denies any  side effect.  She discontinued bupropion as it made no difference.  She tries not to take clonazepam.  She is willing to continue on the current medication.   Substance use  Tobacco Alcohol Other substances/  Current  denies denies  Past  denies denies  Past Treatment        Household: husband, son Marital status:married Number of children: 32 yo son, mildly autistic. 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 Bipolar affective disorder, currently depressed, mild (HCC)  F31.31     2. GAD (generalized anxiety disorder) [F41.1]  F41.1     3. Panic attack  F41.0       Past Psychiatric History: Please see initial evaluation for full details. I have reviewed the history. No updates at this time.     Past Medical History:  Past Medical History:  Diagnosis Date   ADHD (attention deficit hyperactivity disorder)    Anxiety    Bipolar disorder (HCC)    Depression     Past Surgical History:  Procedure Laterality Date   ABDOMINAL HYSTERECTOMY     HERNIA REPAIR     KIDNEY SURGERY Right    KNEE ARTHROSCOPY Bilateral    WRIST SURGERY Left     Family Psychiatric History: Please see initial evaluation for full details. I have reviewed the history. No updates at this time.     Family History:  Family History  Problem Relation Age of Onset   Anxiety disorder Mother    Depression Mother    Schizophrenia Maternal Grandmother     Social History:  Social History   Socioeconomic History   Marital status: Married    Spouse name: greg   Number of children: 1   Years of education: Not on file   Highest education level: Some college, no degree  Occupational History   Not on file  Tobacco Use   Smoking status: Never   Smokeless tobacco: Never  Vaping Use   Vaping Use: Never used  Substance and Sexual Activity   Alcohol use: Not Currently   Drug use: Never   Sexual activity: Yes  Other  Topics Concern   Not on file  Social History Narrative   Not on file   Social Determinants of Health   Financial Resource Strain: Low Risk  (11/28/2018)   Overall Financial Resource Strain (CARDIA)    Difficulty of Paying Living Expenses: Not hard at all  Food Insecurity: No Food Insecurity (11/28/2018)   Hunger Vital Sign    Worried About Running Out of Food in the Last Year: Never true    Ran Out of Food in the Last Year: Never true  Transportation Needs: No Transportation Needs (11/28/2018)   PRAPARE - Administrator, Civil Service (Medical): No    Lack of Transportation (Non-Medical): No  Physical Activity: Sufficiently Active (11/28/2018)   Exercise Vital Sign    Days of Exercise per Week: 6 days    Minutes of Exercise per Session: 60 min  Stress: Stress Concern Present (11/28/2018)   [employer] of Occupational Health - Occupational Stress Questionnaire    Feeling of Stress : Rather much  Social Connections: Unknown (11/28/2018)   Social Connection and Isolation Panel [NHANES]    Frequency of Communication with Friends and Family: Not on file    Frequency of Social Gatherings with Friends and Family: Not on file    Attends Religious Services: Never    Active Member of Clubs or Organizations: No    Attends Banker Meetings: Never    Marital Status: Married    Allergies:  Allergies  Allergen Reactions   Penicillins Hives, Itching and Swelling    And itching    Ciprofloxacin Itching   Morphine Sulfate Itching   Sulfa Antibiotics Itching and Nausea And Vomiting   Tape Itching and Other (See Comments)    Welts & skin peeling off Welts & skin peeling off    Metabolic Disorder Labs: No results found for: "HGBA1C", "MPG" No results found for: "PROLACTIN" No results found for: "CHOL", "TRIG", "HDL", "CHOLHDL", "VLDL", "LDLCALC" Lab Results  Component Value Date   TSH 2.271 12/30/2022    Therapeutic Level Labs: No results found for:  "LITHIUM" No results found for: "VALPROATE" No results found for: "CBMZ"  Current Medications: Current Outpatient Medications  Medication Sig Dispense Refill   amitriptyline (ELAVIL) 100 MG tablet Take 100 mg by mouth at bedtime.     busPIRone (BUSPAR) 30 MG tablet Take 1 tablet (30 mg total) by mouth 2 (two) times daily. 60 tablet 5   lamoTRIgine (LAMICTAL) 200 MG tablet Take 1 tablet (200 mg total) by mouth 2 (two) times daily. 60 tablet 2   [START ON 04/01/2023] LORazepam (ATIVAN) 2 MG tablet Take 1 tablet (2 mg total) by mouth daily as needed for anxiety. 30 tablet 0   [START ON 04/15/2023] sertraline (ZOLOFT) 100 MG tablet  Take 1 tablet (100 mg total) by mouth daily. 30 tablet 0   sertraline (ZOLOFT) 50 MG tablet Take 0.5 tablets (25 mg total) by mouth at bedtime for 7 days, THEN 1 tablet (50 mg total) at bedtime for 7 days. 11 tablet 0   No current facility-administered medications for this visit.     Musculoskeletal: Strength & Muscle Tone:  N/A Gait & Station:  N/A Patient leans: N/A  Psychiatric Specialty Exam: Review of Systems  Psychiatric/Behavioral:  Positive for dysphoric mood and sleep disturbance. Negative for agitation, behavioral problems, confusion, decreased concentration, hallucinations, self-injury and suicidal ideas. The patient is nervous/anxious. The patient is not hyperactive.   All other systems reviewed and are negative.   There were no vitals taken for this visit.There is no height or weight on file to calculate BMI.  General Appearance: Fairly Groomed  Eye Contact:  Good  Speech:  Clear and Coherent  Volume:  Normal  Mood:  Anxious and Depressed  Affect:  Appropriate, Congruent, and fatigue  Thought Process:  Coherent  Orientation:  Full (Time, Place, and Person)  Thought Content: Logical   Suicidal Thoughts:  No  Homicidal Thoughts:  No  Memory:  Immediate;   Good  Judgement:  Good  Insight:  Good  Psychomotor Activity:  Normal  Concentration:   Concentration: Good and Attention Span: Good  Recall:  Good  Fund of Knowledge: Good  Language: Good  Akathisia:  No  Handed:  Right  AIMS (if indicated): not done  Assets:  Communication Skills Desire for Improvement  ADL's:  Intact  Cognition: WNL  Sleep:  Fair   Screenings: GAD-7    Garment/textile technologist Visit from 12/30/2022 in [facility] Office Visit from 11/02/2022 in [facility] Office Visit from 09/09/2022 in [facility] Office Visit from 07/15/2022 in [facility] Office Visit from 06/14/2022 in [facility]  Total GAD-7 Score 12 11 12 11 21       PHQ2-9    Flowsheet Row Office Visit from 12/30/2022 in [facility] Office Visit from 11/02/2022 in [facility] Office Visit from 09/09/2022 in [facility] Office Visit from 07/15/2022 in [facility] Office Visit from 06/14/2022 in [facility]  PHQ-2 Total Score 2 2 2 2 4   PHQ-9 Total Score 6 9 10 7 14         Assessment and Plan:  Amy Leblanc is a 52 y.o. year old female with a history of bipolar disorder, ADHD, insomnia, s/p gastric surgery, s/p nephrectomy, who presents for follow up appointment for below.   1. Bipolar affective disorder, currently depressed, mild (HCC) 2. GAD (generalized anxiety disorder) [F41.1] 3. Panic attack Acute stressors include: her father in relationship, and he has elevated PSA  Other stressors include: loss of her mother, her son with autism    History:   She reports worsening in anxiety, depressive symptoms in the context of stressors as above.  She reports good support from her husband, and  reports good functioning at work, although she tends to be isolated.  She had limited benefit from uptitration of bupropion, and she discontinued this medication.  Will continue current medication regimen given Cystrin has been up titrated yesterday.  Will monitor for any signs of serotonin syndrome given she is also  on amitriptyline.  Will continue lamotrigine for bipolar disorder.  Will continue BuSpar and clonazepam for anxiety.  She was advised to have follow-up with [facility].  Will obtain another lab to rule out medical health issues contributing to fatigue.   # iron deficiency  Ferritin level is marginally low.  She is advised to get iron supplement, which is available over-the-counter.  Will plan to recheck in a few months around in August.    Plan  Continue lamotrigine 200 mg twice a day -charles drew Continue sertraline 100 mg daily- dose uptitrated yesterday Hold bupropion Continue Buspar 30 mg twice daily -charles drew Continue lorazepam 2 mg daily as needed for extreme anxiety  Obtain labs (vitamin D) -[facility] Next appointment: 6/20 at 10 am for 30 mins, video - on amitriptyline 100 mg at night for migraine   Past trials of medication: bupropion/limited benefit, lithium, Depakote, lamotrigine, olanzapine, Abilify, Geodon, quetiapine (weight gain),  risperidone (nausea), Latuda (weight gain)     The patient demonstrates the following risk factors for suicide: Chronic risk factors for suicide include: The. Acute risk factors for suicide include: loss (financial, interpersonal, professional). Protective factors for this patient include: positive social support and hope for the future. Considering these factors, the overall suicide risk at this point appears to be low. Patient is appropriate for outpatient follow up.     Collaboration of Care: Collaboration of Care: Other reviewed notes in Epic  Patient/Guardian was advised Release of Information must be obtained prior to any record release in  order to collaborate their care with an outside provider. Patient/Guardian was advised if they have not already done so to contact the registration department to sign all necessary forms in order for [place] to release information regarding their care.   Consent: Patient/Guardian gives verbal consent for treatment and assignment of benefits for services provided during this visit. Patient/Guardian expressed understanding and agreed to proceed.    Neysa Hotter, MD 03/18/2023, 10:07 AM

## 2023-03-18 ENCOUNTER — Telehealth (INDEPENDENT_AMBULATORY_CARE_PROVIDER_SITE_OTHER): Payer: Self-pay | Admitting: Psychiatry

## 2023-03-18 ENCOUNTER — Encounter: Payer: Self-pay | Admitting: Psychiatry

## 2023-03-18 DIAGNOSIS — F410 Panic disorder [episodic paroxysmal anxiety] without agoraphobia: Secondary | ICD-10-CM

## 2023-03-18 DIAGNOSIS — F411 Generalized anxiety disorder: Secondary | ICD-10-CM

## 2023-03-18 DIAGNOSIS — F3131 Bipolar disorder, current episode depressed, mild: Secondary | ICD-10-CM

## 2023-03-18 MED ORDER — SERTRALINE HCL 100 MG PO TABS: 100.0000 mg | ORAL_TABLET | Freq: Every day | ORAL | 0 refills | Status: DC

## 2023-03-18 MED ORDER — LORAZEPAM 2 MG PO TABS: 2.0000 mg | ORAL_TABLET | Freq: Every day | ORAL | 0 refills | Status: AC | PRN

## 2023-04-28 ENCOUNTER — Telehealth: Payer: Self-pay

## 2023-04-28 ENCOUNTER — Other Ambulatory Visit: Payer: Self-pay | Admitting: Psychiatry

## 2023-04-28 MED ORDER — LAMOTRIGINE 200 MG PO TABS: 200.0000 mg | ORAL_TABLET | Freq: Two times a day (BID) | ORAL | 2 refills | Status: DC

## 2023-04-28 NOTE — Telephone Encounter (Signed)
Ordered

## 2023-04-28 NOTE — Telephone Encounter (Signed)
received fax requesting a refill on the lamotrigine 200mg  take 1 tablet by mouth 2 time daily. pt was last seen on 5-3 next appt 6-20

## 2023-04-28 NOTE — Telephone Encounter (Signed)
no answer no message could be left.    

## 2023-05-01 NOTE — Progress Notes (Unsigned)
Virtual Visit via Video Note  I connected with Amy Leblanc on 05/05/23 at 10:00 AM EDT by a video enabled telemedicine application and verified that I am speaking with the correct person using two identifiers.  Location: Patient: home Provider: office Persons participated in the visit- patient, provider    I discussed the limitations of evaluation and management by telemedicine and the availability of in person appointments. The patient expressed understanding and agreed to proceed.     I discussed the assessment and treatment plan with the patient. The patient was provided an opportunity to ask questions and all were answered. The patient agreed with the plan and demonstrated an understanding of the instructions.   The patient was advised to call back or seek an in-person evaluation if the symptoms worsen or if the condition fails to improve as anticipated.  I provided 15 minutes of non-face-to-face time during this encounter.   Neysa Hotter, MD     [facility] MD/PA/NP OP Progress Note  05/05/2023 11:04 AM Amy Leblanc  MRN:  [id number]  Chief Complaint:  Chief Complaint  Patient presents with   Follow-up   HPI:   - According to the chart review, the following events have occurred since the last visit: The patient was seen by urology. She was recommended referral to nephrology for CKD.   This is a follow-up appointment for bipolar disorder and anxiety.  She states that she continues to feel anxious.  She gets upset with her son.  He tends to take things on her, and she had a panic attacks.  She feels that she has elephant on the chest, which happens a few times per week.  Although she has been able to accomplish things at work, she agrees that there is a constant turmoil inside her.  Her father is currently at the beach with anxiety.  He underwent chemotherapy and PSA got low. He told her that she needs to get over it. She felt it was disrespectful. She wonders if she just has to  live with it.  Explored the way she can maintain her connection with others while acknowledging her grief.  She has insomnia.  She has low appetite.  She denies SI.  She agrees with the plan as below.   Household: husband, son Marital status:married Number of children: 57 yo son, mildly autistic. 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 Bipolar affective disorder, currently depressed, mild (HCC)  F31.31     2. GAD (generalized anxiety disorder) [F41.1]  F41.1     3. Panic attack  F41.0       Past Psychiatric History: Please see initial evaluation for full details. I have reviewed the history. No updates at this time.     Past Medical History:  Past Medical History:  Diagnosis Date   ADHD (attention  deficit hyperactivity disorder)    Anxiety    Bipolar disorder (HCC)    Depression     Past Surgical History:  Procedure Laterality Date   ABDOMINAL HYSTERECTOMY     HERNIA REPAIR     KIDNEY SURGERY Right    KNEE ARTHROSCOPY Bilateral    WRIST SURGERY Left     Family Psychiatric History: Please see initial evaluation for full details. I have reviewed the history. No updates at this time.     Family History:  Family History  Problem Relation Age of Onset   Anxiety disorder Mother    Depression Mother    Schizophrenia Maternal Grandmother     Social History:  Social History   Socioeconomic History   Marital  status: Married    Spouse name: greg   Number of children: 1   Years of education: Not on file   Highest education level: Some college, no degree  Occupational History   Not on file  Tobacco Use   Smoking status: Never   Smokeless tobacco: Never  Vaping Use   Vaping Use: Never used  Substance and Sexual Activity   Alcohol use: Not Currently   Drug use: Never   Sexual activity: Yes  Other Topics Concern   Not on file  Social History Narrative   Not on file   Social Determinants of Health   Financial Resource Strain: Low Risk  (11/28/2018)   Overall Financial Resource Strain (CARDIA)    Difficulty of Paying Living Expenses: Not hard at all  Food Insecurity: No Food Insecurity (11/28/2018)   Hunger Vital Sign    Worried About Running Out of Food in the Last Year: Never true    Ran Out of Food in the Last Year: Never true  Transportation Needs: No Transportation Needs (11/28/2018)   PRAPARE - Administrator, Civil Service (Medical): No    Lack of Transportation (Non-Medical): No  Physical Activity: Sufficiently Active (11/28/2018)   Exercise Vital Sign    Days of Exercise per Week: 6 days    Minutes of Exercise per Session: 60 min  Stress: Stress Concern Present (11/28/2018)   [employer] of Occupational Health - Occupational Stress Questionnaire    Feeling of Stress : Rather much  Social Connections: Unknown (11/28/2018)   Social Connection and Isolation Panel [NHANES]    Frequency of Communication with Friends and Family: Not on file    Frequency of Social Gatherings with Friends and Family: Not on file    Attends Religious Services: Never    Active Member of Clubs or Organizations: No    Attends Banker Meetings: Never    Marital Status: Married    Allergies:  Allergies  Allergen Reactions   Penicillins Hives, Itching and Swelling    And itching    Ciprofloxacin Itching   Morphine Sulfate Itching   Sulfa Antibiotics Itching and Nausea  And Vomiting   Tape Itching and Other (See Comments)    Welts & skin peeling off Welts & skin peeling off    Metabolic Disorder Labs: No results found for: "HGBA1C", "MPG" No results found for: "PROLACTIN" No results found for: "CHOL", "TRIG", "HDL", "CHOLHDL", "VLDL", "LDLCALC" Lab Results  Component Value Date   TSH 2.271 12/30/2022    Therapeutic Level Labs: No results found for: "LITHIUM" No results found for: "VALPROATE" No results found for: "CBMZ"  Current Medications: Current Outpatient Medications  Medication Sig Dispense Refill   LORazepam (ATIVAN) 2 MG tablet Take  1 tablet (2 mg total) by mouth daily as needed for anxiety. 30 tablet 1   amitriptyline (ELAVIL) 100 MG tablet Take 100 mg by mouth at bedtime.     busPIRone (BUSPAR) 30 MG tablet Take 1 tablet (30 mg total) by mouth 2 (two) times daily. 60 tablet 5   lamoTRIgine (LAMICTAL) 200 MG tablet Take 1 tablet (200 mg total) by mouth 2 (two) times daily. 60 tablet 2   [START ON 05/15/2023] sertraline (ZOLOFT) 100 MG tablet Take 1 tablet (100 mg total) by mouth daily. 30 tablet 1   sertraline (ZOLOFT) 50 MG tablet Take 0.5 tablets (25 mg total) by mouth at bedtime for 7 days, THEN 1 tablet (50 mg total) at bedtime for 7 days. 11 tablet 0   No current facility-administered medications for this visit.     Musculoskeletal: Strength & Muscle Tone:  N/A Gait & Station:  N/A Patient leans: N/A  Psychiatric Specialty Exam: Review of Systems  Psychiatric/Behavioral:  Positive for dysphoric mood and sleep disturbance. Negative for agitation, behavioral problems, confusion, decreased concentration, hallucinations, self-injury and suicidal ideas. The patient is nervous/anxious. The patient is not hyperactive.   All other systems reviewed and are negative.   There were no vitals taken for this visit.There is no height or weight on file to calculate BMI.  General Appearance: Fairly Groomed  Eye Contact:  Good  Speech:   Clear and Coherent  Volume:  Normal  Mood:  Depressed  Affect:  Appropriate, Congruent, and down  Thought Process:  Coherent  Orientation:  Full (Time, Place, and Person)  Thought Content: Logical   Suicidal Thoughts:  No  Homicidal Thoughts:  No  Memory:  Immediate;   Good  Judgement:  Good  Insight:  Good  Psychomotor Activity:  Normal  Concentration:  Concentration: Good and Attention Span: Good  Recall:  Good  Fund of Knowledge: Good  Language: Good  Akathisia:  No  Handed:  Right  AIMS (if indicated): not done  Assets:  Communication Skills Desire for Improvement  ADL's:  Intact  Cognition: WNL  Sleep:  Fair   Screenings: GAD-7    Garment/textile technologist Visit from 12/30/2022 in [facility] Visit from 11/02/2022 in [facility] Visit from 09/09/2022 in [facility] Visit from 07/15/2022 in [facility] Visit from 06/14/2022 in [facility]  Total GAD-7 Score 12 11 12 11 21       PHQ2-9    Flowsheet Row Office Visit from 12/30/2022 in [facility] Office Visit from 11/02/2022 in [facility] Visit from 09/09/2022 in [facility] Visit from 07/15/2022 in [facility] Visit from 06/14/2022 in [facility]  PHQ-2 Total Score 2 2 2 2 4   PHQ-9 Total Score 6 9 10 7 14         Assessment and Plan:  Amy Leblanc is a 52 y.o. year old female with a history of bipolar disorder, ADHD, insomnia, s/p gastric surgery, s/p nephrectomy, who presents for follow up appointment for below.   1. Bipolar affective disorder, currently depressed, mild  (HCC) 2. GAD (generalized anxiety disorder) [F41.1] 3. Panic attack Acute stressors include: her father in relationship, and he has elevated PSA  Other stressors include: loss of her mother, her  son with autism    History:    She continues to experience anxiety and depressive symptoms in the context of stressors as above.  She reports good support from her husband, and has been able to go to work regularly.  She agrees to taper off BuSpar first before uptitration of sertraline to optimize treatment for anxiety and depression to avoid risk of serotonin syndrome given she is also on amitriptyline.  Will continue lamotrigine for bipolar disorder.  Will continue clonazepam as needed for anxiety.     # iron deficiency  Ferritin level is marginally low.  She is advised to get iron supplement, which is available over-the-counter.  Will plan to recheck in a few months around in August.    Plan  Continue lamotrigine 200 mg twice a day -charles drew Continue sertraline 100 mg daily Decrease Buspar by 15 mg every week until discontinuation -currently takes 30 mg twice a day Continue lorazepam 2 mg daily as needed for extreme anxiety - [facility] in [place] Next appointment: 7/26 at 8 am for 30 mins, video - on amitriptyline 100 mg at night for migraine   Past trials of medication: bupropion (limited benefit), lithium, Depakote, lamotrigine, olanzapine, Abilify, Geodon, quetiapine (weight gain),  risperidone (nausea), Latuda (weight gain)    Collaboration of Care: Collaboration of Care: Other reviewed notes in Epic  Patient/Guardian was advised Release of Information must be obtained prior to any record release in order to collaborate their care with an outside provider. Patient/Guardian was advised if they have not already done so to contact the registration department to sign all necessary forms in order for [place] to release information regarding their care.   Consent: Patient/Guardian gives verbal  consent for treatment and assignment of benefits for services provided during this visit. Patient/Guardian expressed understanding and agreed to proceed.    Neysa Hotter, MD 05/05/2023, 11:04 AM

## 2023-05-03 ENCOUNTER — Other Ambulatory Visit
Admission: RE | Admit: 2023-05-03 | Discharge: 2023-05-03 | Disposition: A | Discharge status: Home / Self Care | Payer: Self-pay | Source: Ambulatory Visit | Attending: Psychiatry | Admitting: Psychiatry

## 2023-05-03 DIAGNOSIS — F3131 Bipolar disorder, current episode depressed, mild: Secondary | ICD-10-CM | POA: Insufficient documentation

## 2023-05-03 LAB — VITAMIN D 25 HYDROXY (VIT D DEFICIENCY, FRACTURES): Vit D, 25-Hydroxy: 41.24 ng/mL (ref 30–100)

## 2023-05-04 ENCOUNTER — Encounter: Payer: Self-pay | Admitting: Psychiatry

## 2023-05-05 ENCOUNTER — Encounter: Payer: Self-pay | Admitting: Psychiatry

## 2023-05-05 ENCOUNTER — Telehealth (INDEPENDENT_AMBULATORY_CARE_PROVIDER_SITE_OTHER): Payer: Self-pay | Admitting: Psychiatry

## 2023-05-05 DIAGNOSIS — F3131 Bipolar disorder, current episode depressed, mild: Secondary | ICD-10-CM

## 2023-05-05 DIAGNOSIS — F411 Generalized anxiety disorder: Secondary | ICD-10-CM

## 2023-05-05 DIAGNOSIS — F410 Panic disorder [episodic paroxysmal anxiety] without agoraphobia: Secondary | ICD-10-CM

## 2023-05-05 MED ORDER — SERTRALINE HCL 100 MG PO TABS: 100.0000 mg | ORAL_TABLET | Freq: Every day | ORAL | 1 refills | Status: DC

## 2023-05-05 MED ORDER — LORAZEPAM 2 MG PO TABS: 2.0000 mg | ORAL_TABLET | Freq: Every day | ORAL | 1 refills | Status: AC | PRN

## 2023-05-05 NOTE — Patient Instructions (Signed)
Continue lamotrigine 200 mg twice a day  Continue sertraline 100 mg daily Decrease Buspar by 15 mg every week until discontinuation  Continue lorazepam 2 mg daily as needed for extreme anxiety  Next appointment: 7/26 at 8 am f

## 2023-06-05 NOTE — Progress Notes (Signed)
Virtual Visit via Video Note  I connected with Amy Leblanc on 06/10/23 at  8:00 AM EDT by a video enabled telemedicine application and verified that I am speaking with the correct person using two identifiers.  Location: Patient: work Provider: office Persons participated in the visit- patient, provider    I discussed the limitations of evaluation and management by telemedicine and the availability of in person appointments. The patient expressed understanding and agreed to proceed.     I discussed the assessment and treatment plan with the patient. The patient was provided an opportunity to ask questions and all were answered. The patient agreed with the plan and demonstrated an understanding of the instructions.   The patient was advised to call back or seek an in-person evaluation if the symptoms worsen or if the condition fails to improve as anticipated.  I provided 20 minutes of non-face-to-face time during this encounter.   Neysa Hotter, MD    [facility] MD/PA/NP OP Progress Note  06/10/2023 8:26 AM Amy Leblanc  MRN:  [id number]  Chief Complaint:  Chief Complaint  Patient presents with   Follow-up   HPI:  This is a follow-up appointment for bipolar disorder, anxiety.  She states that she continues to experience anxiety, and panic attacks with chest tightness, shortness of breath and feeling shaky.  She reports frustration towards her father, who is self absorbed with lady he is in relationship with.  He talks about her all the time.  Although she understands that this lady is sweet and her family likes her, she feels tired of hearing about her.  She feels annoyed.  She thinks it is as if he is comparing with her mother, and she also does not feel listened or cared.  She feels like she would be going to explode at some point, and may something she would regret.  She agrees to leave the situation when possible.  She sleeps well.  She denies SI.  There were a few occasions she was  cleaning the house through the entire day, although she denies decreased need for sleep or euphoria.  She has not noticed much difference since tapering off BuSpar.  She is willing to try higher dose of sertraline at this time.   Substance use   Tobacco Alcohol Other substances/  Current   denies denies  Past   denies denies  Past Treatment            Household: husband, son Marital status:married Number of children: 86 yo son, mildly autistic. 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 Bipolar affective disorder, currently depressed, mild (HCC)  F31.31     2. GAD (generalized anxiety disorder) [F41.1]  F41.1     3. Panic attack  F41.0       Past Psychiatric History: Please see initial evaluation for full details. I have reviewed the history. No updates at this time.     Past Medical History:  Past Medical History:  Diagnosis Date   ADHD (attention deficit hyperactivity disorder)    Anxiety    Bipolar disorder (HCC)    Depression     Past Surgical History:  Procedure Laterality Date   ABDOMINAL HYSTERECTOMY     HERNIA REPAIR     KIDNEY SURGERY Right    KNEE ARTHROSCOPY Bilateral    WRIST SURGERY Left     Family Psychiatric History: Please see initial  evaluation for full details. I have reviewed the history. No updates at this time.     Family History:  Family History  Problem Relation Age of Onset   Anxiety disorder Mother    Depression Mother    Schizophrenia Maternal Grandmother     Social History:  Social History   Socioeconomic History   Marital status: Married    Spouse name: greg   Number of children: 1   Years of education: Not on file   Highest education level: Some college, no degree  Occupational History   Not on file  Tobacco Use   Smoking status: Never   Smokeless tobacco: Never  Vaping Use   Vaping status: Never Used  Substance and Sexual Activity    Alcohol use: Not Currently   Drug use: Never   Sexual activity: Yes  Other Topics Concern   Not on file  Social History Narrative   Not on file   Social Determinants of Health   Financial Resource Strain: Medium Risk (10/15/2020)   [facility], [facility]   Overall Financial Resource Strain (CARDIA)    Difficulty of Paying Living Expenses: Somewhat hard  Food Insecurity: No Food Insecurity (10/15/2020)   [facility], [facility]   Hunger Vital Sign    Worried About Running Out of Food in the Last Year: Never true    Ran Out of Food in the Last Year: Never true  Transportation Needs: No Transportation Needs (10/15/2020)   Received [facility], [facility]   PRAPARE - Transportation    Lack of Transportation (Medical): No    Lack of Transportation (Non-Medical): No  Physical Activity: Sufficiently Active (11/28/2018)   Exercise Vital Sign    Days of Exercise per Week: 6 days    Minutes of Exercise per Session: 60 min  Stress: Stress Concern Present (11/28/2018)   [employer] of Occupational Health - Occupational Stress Questionnaire    Feeling of Stress : Rather much  Social Connections: Unknown (01/12/2019)   Received from [facility], [facility]   Social Connection and Isolation Panel [NHANES]    Frequency of Communication with Friends and Family: Not on file    Frequency of Social Gatherings with Friends and Family: Not on file    Attends Religious Services: Not on file    Active Member of Clubs or Organizations: Not on file    Attends Banker Meetings: Not on file    Marital Status: Married    Allergies:  Allergies  Allergen Reactions   Penicillins Hives, Itching and Swelling    And itching    Ciprofloxacin Itching   Morphine Sulfate Itching   Sulfa Antibiotics Itching and Nausea And Vomiting   Tape Itching and Other (See Comments)    Welts & skin peeling off Welts & skin peeling  off    Metabolic Disorder Labs: No results found for: "HGBA1C", "MPG" No results found for: "PROLACTIN" No results found for: "CHOL", "TRIG", "HDL", "CHOLHDL", "VLDL", "LDLCALC" Lab Results  Component Value Date   TSH 2.271 12/30/2022    Therapeutic Level Labs: No results found for: "LITHIUM" No results found for: "VALPROATE" No results found for: "CBMZ"  Current Medications: Current Outpatient Medications  Medication Sig Dispense Refill   amitriptyline (ELAVIL) 100 MG tablet Take 100 mg by mouth at bedtime.     busPIRone (BUSPAR) 30 MG tablet Take 1 tablet (30 mg total) by mouth 2 (  two) times daily. 60 tablet 5   lamoTRIgine (LAMICTAL) 200 MG tablet Take 1 tablet (200 mg total) by mouth 2 (two) times daily. 60 tablet 2   LORazepam (ATIVAN) 2 MG tablet Take 1 tablet (2 mg total) by mouth daily as needed for anxiety. 30 tablet 1   sertraline (ZOLOFT) 100 MG tablet Take 1 tablet (100 mg total) by mouth daily. 30 tablet 1   sertraline (ZOLOFT) 50 MG tablet Take 0.5 tablets (25 mg total) by mouth at bedtime for 7 days, THEN 1 tablet (50 mg total) at bedtime for 7 days. 11 tablet 0   No current facility-administered medications for this visit.     Musculoskeletal: Strength & Muscle Tone:  N/A Gait & Station:  N/A Patient leans: N/A  Psychiatric Specialty Exam: Review of Systems  Psychiatric/Behavioral:  Positive for dysphoric mood. Negative for agitation, behavioral problems, confusion, decreased concentration, hallucinations, self-injury, sleep disturbance and suicidal ideas. The patient is nervous/anxious. The patient is not hyperactive.   All other systems reviewed and are negative.   There were no vitals taken for this visit.There is no height or weight on file to calculate BMI.  General Appearance: Fairly Groomed  Eye Contact:  Good  Speech:  Clear and Coherent  Volume:  Normal  Mood:  Anxious  Affect:  Appropriate, Congruent, and tense  Thought Process:  Coherent   Orientation:  Full (Time, Place, and Person)  Thought Content: Logical   Suicidal Thoughts:  No  Homicidal Thoughts:  No  Memory:  Immediate;   Good  Judgement:  Good  Insight:  Good  Psychomotor Activity:  Normal  Concentration:  Concentration: Good and Attention Span: Good  Recall:  Good  Fund of Knowledge: Good  Language: Good  Akathisia:  No  Handed:  Right  AIMS (if indicated): not done  Assets:  Communication Skills Desire for Improvement  ADL's:  Intact  Cognition: WNL  Sleep:  Fair   Screenings: GAD-7    Garment/textile technologist Visit from 12/30/2022 in [facility] Visit from 11/02/2022 in [facility] Visit from 09/09/2022 in [facility] Visit from 07/15/2022 in [facility] Office Visit from 06/14/2022 in [facility]  Total GAD-7 Score 12 11 12 11 21       PHQ2-9    Flowsheet Row Office Visit from 12/30/2022 in [facility] Office Visit from 11/02/2022 in [facility] Office Visit from 09/09/2022 in [facility] Office Visit from 07/15/2022 in [facility] Office Visit from 06/14/2022 in [facility]  PHQ-2 Total Score 2 2 2 2 4   PHQ-9 Total Score 6 9 10 7 14         Assessment and Plan:  Amy Leblanc is a 52 y.o. year old female with a history of bipolar disorder, ADHD, insomnia, s/p gastric surgery, s/p nephrectomy, who presents for follow up appointment for below.   1. Bipolar affective disorder, currently depressed, mild (HCC) 2. GAD (generalized anxiety disorder) [F41.1] 3. Panic attack Acute stressors include: her father in relationship, and he has  elevated PSA  Other stressors include: loss of her mother, her son with autism    History:     She continues to experience significant anxiety and depressive symptoms in the context of stressors as above.  She was able to taper off BuSpar to avoid polypharmacy given it had limited benefit .  We uptitrate sertraline to optimize treatment for bipolar depression and anxiety.  Discussed potential risk of serotonin syndrome, medication induced mania.  Will continue lamotrigine for bipolar disorder.  Will continue lorazepam as needed for anxiety.     # iron deficiency  Ferritin level is marginally low.  She is advised to get iron supplement, which is available over-the-counter.  Will plan to recheck in a few months around in August.    Plan  Continue lamotrigine 200 mg twice a day -charles drew Increase sertraline 150 mg daily Continue lorazepam 2 mg daily as needed for extreme anxiety - [facility] in [place] Next appointment: 9/4 at  8 30 for 30 mins, video - on amitriptyline 100 mg at night for migraine   Past trials of medication: Buspar (limited benefit), bupropion (limited benefit), lithium, Depakote, lamotrigine, olanzapine, Abilify, Geodon, quetiapine (weight gain),  risperidone (nausea), Latuda (weight gain) (unable to afford Vraylar)   The patient demonstrates the following risk factors for suicide: Chronic risk factors for suicide include: The. Acute risk factors for suicide include: loss (financial, interpersonal, professional). Protective factors for this patient include: positive social support and hope for the future. Considering these factors, the overall suicide risk at this point appears to be low. Patient is appropriate for outpatient follow up.     Collaboration of Care: Collaboration of Care: Other reviewed notes in Epic  Patient/Guardian was advised Release of Information must be obtained prior to any record release in order to collaborate their care with an outside provider.  Patient/Guardian was advised if they have not already done so to contact the registration department to sign all necessary forms in order for [place] to release information regarding their care.   Consent: Patient/Guardian gives verbal consent for treatment and assignment of benefits for services provided during this visit. Patient/Guardian expressed understanding and agreed to proceed.    Neysa Hotter, MD 06/10/2023, 8:26 AM

## 2023-06-10 ENCOUNTER — Encounter: Payer: Self-pay | Admitting: Psychiatry

## 2023-06-10 ENCOUNTER — Telehealth (INDEPENDENT_AMBULATORY_CARE_PROVIDER_SITE_OTHER): Payer: Self-pay | Admitting: Psychiatry

## 2023-06-10 DIAGNOSIS — F410 Panic disorder [episodic paroxysmal anxiety] without agoraphobia: Secondary | ICD-10-CM

## 2023-06-10 DIAGNOSIS — F411 Generalized anxiety disorder: Secondary | ICD-10-CM

## 2023-06-10 DIAGNOSIS — F3131 Bipolar disorder, current episode depressed, mild: Secondary | ICD-10-CM

## 2023-06-10 MED ORDER — LORAZEPAM 2 MG PO TABS: 2.0000 mg | ORAL_TABLET | Freq: Every day | ORAL | 0 refills | Status: AC | PRN

## 2023-06-10 MED ORDER — SERTRALINE HCL 100 MG PO TABS: 150.0000 mg | ORAL_TABLET | Freq: Every day | ORAL | 1 refills | Status: DC

## 2023-06-10 NOTE — Patient Instructions (Signed)
Continue lamotrigine 200 mg twice a day  Increase sertraline 150 mg daily Continue lorazepam 2 mg daily as needed for extreme anxiety  Next appointment: 9/4 at  8 30

## 2023-07-13 ENCOUNTER — Encounter (INDEPENDENT_AMBULATORY_CARE_PROVIDER_SITE_OTHER): Payer: Self-pay

## 2023-07-13 DIAGNOSIS — G4700 Insomnia, unspecified: Secondary | ICD-10-CM

## 2023-07-14 MED ORDER — TRAZODONE HCL 50 MG PO TABS: 25.0000 mg | ORAL_TABLET | Freq: Every evening | ORAL | 0 refills | Status: DC | PRN

## 2023-07-14 NOTE — Telephone Encounter (Signed)
I've spent a total time of 5 minutes providing service to this patient-generated inquiry in the MyChart message

## 2023-07-16 NOTE — Progress Notes (Unsigned)
Virtual Visit via Video Note  I connected with Amy Leblanc on 07/20/23 at  8:30 AM EDT by a video enabled telemedicine application and verified that I am speaking with the correct person using two identifiers.  Location: Patient: work Provider: office Persons participated in the visit- patient, provider    I discussed the limitations of evaluation and management by telemedicine and the availability of in person appointments. The patient expressed understanding and agreed to proceed.   I discussed the assessment and treatment plan with the patient. The patient was provided an opportunity to ask questions and all were answered. The patient agreed with the plan and demonstrated an understanding of the instructions.   The patient was advised to call back or seek an in-person evaluation if the symptoms worsen or if the condition fails to improve as anticipated.  I provided 25 minutes of non-face-to-face time during this encounter.   Neysa Hotter, MD    [facility] MD/PA/NP OP Progress Note  07/20/2023 9:06 AM Amy Leblanc  MRN:  [id number]  Chief Complaint:  Chief Complaint  Patient presents with   Follow-up   HPI:  This is a follow-up appointment for anxiety, bipolar disorder.  She states that she has been under a lot of stress.  She has stage IIIb kidney disease.  She also has left flank mass.  They could not confirm it on [place], and she is planning to get CT scan today.  She has been worried about this.  She had right nephrectomy, and was concerned what could happen if she were to lose the other one.  She is concerned about the caring for her son with autism.  Her husband is older.  She agrees to take one step at a time especially she will have CT scan today, while she has been validated her concern.  She had an episode of excessive cleaning.  She denies euphoria, but has agreed that she was feeling very anxious.  She has insomnia.  She did not have any effect from trazodone.  She denies SI.   She denies decreased need for sleep or euphoria.  She has been taking lorazepam almost every day.  She denies alcohol use or drug use.    Visit Diagnosis:    ICD-10-CM   1. Bipolar affective disorder, currently depressed, mild (HCC)  F31.31     2. GAD (generalized anxiety disorder) [F41.1]  F41.1     3. Panic attack  F41.0     4. Screening for iron deficiency anemia  Z13.0 Ferritin    Iron and TIBC      Past Psychiatric History: Please see initial evaluation for full details. I have reviewed the history. No updates at this time.     Past Medical History:  Past Medical History:  Diagnosis Date   ADHD (attention deficit hyperactivity disorder)    Anxiety    Bipolar disorder (HCC)    Depression     Past Surgical History:  Procedure Laterality Date   ABDOMINAL HYSTERECTOMY     HERNIA REPAIR     KIDNEY SURGERY Right    KNEE ARTHROSCOPY Bilateral    WRIST SURGERY Left     Family Psychiatric History: Please see initial evaluation for full details. I have reviewed the history. No updates at this time.     Family History:  Family History  Problem Relation Age of Onset   Anxiety disorder Mother    Depression Mother    Schizophrenia Maternal Grandmother     Social History:  Social History   Socioeconomic History   Marital status: Married    Spouse name: greg   Number of children: 1   Years of education: Not on file   Highest education level: Some college, no degree  Occupational History   Not on file  Tobacco Use   Smoking status: Never   Smokeless tobacco: Never  Vaping Use   Vaping status: Never Used  Substance and Sexual Activity   Alcohol use: Not Currently   Drug use: Never   Sexual activity: Yes  Other Topics Concern   Not on file  Social History Narrative   Not on file   Social Determinants of Health   Financial Resource Strain: Medium Risk (10/15/2020)   Received from [facility]   Overall Financial Resource Strain (CARDIA)     Difficulty of Paying Living Expenses: Somewhat hard  Food Insecurity: No Food Insecurity (10/15/2020)   Received from [facility], [facility]   Hunger Vital Sign    Worried About Running Out of Food in the Last Year: Never true    Ran Out of Food in the Last Year: Never true  Transportation Needs: No Transportation Needs (10/15/2020)   Received from [facility], [facility]   [facility] - Transportation    Lack of Transportation (Medical): No    Lack of Transportation (Non-Medical): No  Physical Activity: Sufficiently Active (11/28/2018)   Exercise Vital Sign    Days of Exercise per Week: 6 days    Minutes of Exercise per Session: 60 min  Stress: Stress Concern Present (11/28/2018)   [employer] of Occupational Health - Occupational Stress Questionnaire    Feeling of Stress : Rather much  Social Connections: Unknown (01/12/2019)   Received from [facility], [facility]   Social Connection and Isolation Panel [NHANES]    Frequency of Communication with Friends and Family: Not on file    Frequency of Social Gatherings with Friends and Family: Not on file    Attends Religious Services: Not on file    Active Member of Clubs or Organizations: Not on file    Attends Banker Meetings: Not on file    Marital Status: Married    Allergies:  Allergies  Allergen Reactions   Penicillins Hives, Itching and Swelling    And itching    Ciprofloxacin Itching   Morphine Sulfate Itching   Sulfa Antibiotics Itching and Nausea And Vomiting   Tape Itching and Other (See Comments)    Welts & skin peeling off Welts & skin peeling off    Metabolic Disorder Labs: No results found for: "HGBA1C", "MPG" No results found for: "PROLACTIN" No results found for: "CHOL", "TRIG", "HDL", "CHOLHDL", "VLDL", "LDLCALC" Lab Results  Component Value Date   TSH 2.271 12/30/2022    Therapeutic Level Labs: No results found for: "LITHIUM" No results found for:  "VALPROATE" No results found for: "CBMZ"  Current Medications: Current Outpatient Medications  Medication Sig Dispense Refill   amitriptyline (ELAVIL) 100 MG tablet Take 100 mg by mouth at bedtime.     [START ON 07/27/2023] lamoTRIgine (LAMICTAL) 200 MG tablet Take 1 tablet (200 mg total) by mouth 2 (two) times daily. 60 tablet 5   LORazepam (ATIVAN) 2 MG tablet Take 1 tablet (2 mg total) by mouth daily as needed for anxiety. 30 tablet 0   sertraline (ZOLOFT) 100 MG tablet Take 1 tablet (100 mg total) by mouth daily. 30 tablet 1   [  START ON 08/09/2023] sertraline (ZOLOFT) 100 MG tablet Take 1.5 tablets (150 mg total) by mouth daily. 45 tablet 1   No current facility-administered medications for this visit.     Musculoskeletal: Strength & Muscle Tone:  N/A Gait & Station:  N/A Patient leans: N/A  Psychiatric Specialty Exam: Review of Systems  Psychiatric/Behavioral:  Positive for decreased concentration, dysphoric mood and sleep disturbance. Negative for agitation, behavioral problems, confusion, hallucinations, self-injury and suicidal ideas. The patient is nervous/anxious. The patient is not hyperactive.   All other systems reviewed and are negative.   There were no vitals taken for this visit.There is no height or weight on file to calculate BMI.  General Appearance: Fairly Groomed  Eye Contact:  Good  Speech:  Clear and Coherent  Volume:  Normal  Mood:   not well  Affect:  Appropriate, Congruent, and down  Thought Process:  Coherent  Orientation:  Full (Time, Place, and Person)  Thought Content: Logical   Suicidal Thoughts:  No  Homicidal Thoughts:  No  Memory:  Immediate;   Good  Judgement:  Good  Insight:  Good  Psychomotor Activity:  Normal  Concentration:  Concentration: Good and Attention Span: Good  Recall:  Good  Fund of Knowledge: Good  Language: Good  Akathisia:  No  Handed:  Right  AIMS (if indicated): not done  Assets:  Communication Skills Desire for  Improvement  ADL's:  Intact  Cognition: WNL  Sleep:  Poor   Screenings: GAD-7    Flowsheet Row Office Visit from 12/30/2022 in [facility] Office Visit from 11/02/2022 in [facility] Office Visit from 09/09/2022 in [facility] Office Visit from 07/15/2022 in [facility] Office Visit from 06/14/2022 in [facility]  Total GAD-7 Score 12 11 12 11 21       PHQ2-9    Flowsheet Row Office Visit from 12/30/2022 in [facility] Office Visit from 11/02/2022 in [facility] Office Visit from 09[facility]09/2022 in [facility] Office Visit from 07/15/2022 in [facility] Office Visit from 06/14/2022 in [facility]  PHQ-2 Total Score 2 2 2 2 4   PHQ-9 Total Score 6 9 10 7 14         Assessment and Plan:  Amy Leblanc is a 52 y.o. year old female with a history of bipolar disorder, ADHD, insomnia, s/p gastric surgery, s/p nephrectomy, who presents for follow up appointment for below.   1. Bipolar affective disorder, currently depressed, mild (HCC) 2. GAD (generalized anxiety disorder) [F41.1] 3. Panic attack Acute stressors include: her father in relationship, and he has elevated PSA, concern about left flank mass, CKD  Other stressors include: loss of her mother, her son with autism    History:     There has been worsening in anxiety in the context of stressors as above.  Pharmacological treatment is limited due to her being on amitriptyline for migraine/concern of serotonin syndrome.  She was advised to contact her provider to see if there is any option of coming off this medication, although she may stay on this  if this has been effective.  May consider switching to fluoxetine in the future if any worsening.  She prefers to stay on the current medication regimen at this time, while waiting for CT scan.  Will  continue current dose of sertraline to target depression and anxiety along with lamotrigine for bipolar disorder.  Will continue lorazepam as needed for anxiety.   4. Screening for iron deficiency anemia # Fatigue Ferritin level was marginally low in Feb.  She has not been taking any iron supplement.  Will recheck the level.   # Insomnia Worsening as her mood worsens.  She reports limited benefit from trazodone.  She was advised to hold this medication at this time.  Will not try other hypnotics at this time as she is also on lorazepam.    Plan  Continue lamotrigine 200 mg twice a day -charles drew Continue sertraline 150 mg daily Continue lorazepam 2 mg daily as needed for extreme anxiety - [facility] in [place] - refill left Obtain lab- iron panel at [place] Discontinue trazodone Next appointment: 10/30 at 10 am for 30 mins, video - on amitriptyline 100 mg at night for migraine  Past trials of medication: Buspar (limited benefit), bupropion (limited benefit), lithium, Depakote, lamotrigine, olanzapine, Abilify, Geodon, quetiapine (weight gain),  risperidone (nausea), Latuda (weight gain) (unable to afford Vraylar)   The patient demonstrates the following risk factors for suicide: Chronic risk factors for suicide include: The. Acute risk factors for suicide include: loss (financial, interpersonal, professional). Protective factors for this patient include: positive social support and hope for the future. Considering these factors, the overall suicide risk at this point appears to be low. Patient is appropriate for outpatient follow up.   Collaboration of Care: Collaboration of Care: Other reviewed notes in Epic  Patient/Guardian was advised Release of Information must be obtained prior to any  record release in order to collaborate their care with an outside provider. Patient/Guardian was advised if they have not already done so to contact the registration department to sign all necessary forms in order for [place] to release information regarding their care.   Consent: Patient/Guardian gives verbal consent for treatment and assignment of benefits for services provided during this visit. Patient/Guardian expressed understanding and agreed to proceed.    Neysa Hotter, MD 07/20/2023, 9:06 AM

## 2023-07-20 ENCOUNTER — Telehealth (INDEPENDENT_AMBULATORY_CARE_PROVIDER_SITE_OTHER): Payer: Self-pay | Admitting: Psychiatry

## 2023-07-20 ENCOUNTER — Encounter: Payer: Self-pay | Admitting: Psychiatry

## 2023-07-20 DIAGNOSIS — F3131 Bipolar disorder, current episode depressed, mild: Secondary | ICD-10-CM

## 2023-07-20 DIAGNOSIS — Z130 Encounter for screening for diseases of the blood and blood-forming organs and certain disorders involving the immune mechanism: Secondary | ICD-10-CM

## 2023-07-20 DIAGNOSIS — F411 Generalized anxiety disorder: Secondary | ICD-10-CM

## 2023-07-20 DIAGNOSIS — F410 Panic disorder [episodic paroxysmal anxiety] without agoraphobia: Secondary | ICD-10-CM

## 2023-07-20 MED ORDER — SERTRALINE HCL 100 MG PO TABS: 150.0000 mg | ORAL_TABLET | Freq: Every day | ORAL | 1 refills | Status: DC

## 2023-07-20 MED ORDER — LAMOTRIGINE 200 MG PO TABS: 200.0000 mg | ORAL_TABLET | Freq: Two times a day (BID) | ORAL | 5 refills | Status: DC

## 2023-07-20 NOTE — Patient Instructions (Addendum)
Continue lamotrigine 200 mg twice a day  Continue sertraline 150 mg daily Continue lorazepam 2 mg daily as needed for extreme anxiety  Obtain lab- iron panel at [place] Discontinue trazodone Next appointment: 10/30 at 10 am

## 2023-08-17 ENCOUNTER — Other Ambulatory Visit: Payer: Self-pay | Admitting: Psychiatry

## 2023-08-17 MED ORDER — LORAZEPAM 2 MG PO TABS: 2.0000 mg | ORAL_TABLET | Freq: Every day | ORAL | 0 refills | Status: DC | PRN

## 2023-09-07 NOTE — Progress Notes (Signed)
Virtual Visit via Video Note  I connected with Amy Leblanc on 09/14/23 at 10:00 AM EDT by a video enabled telemedicine application and verified that I am speaking with the correct person using two identifiers.  Location: Patient: work Provider: office Persons participated in the visit- patient, provider    I discussed the limitations of evaluation and management by telemedicine and the availability of in person appointments. The patient expressed understanding and agreed to proceed.    I discussed the assessment and treatment plan with the patient. The patient was provided an opportunity to ask questions and all were answered. The patient agreed with the plan and demonstrated an understanding of the instructions.   The patient was advised to call back or seek an in-person evaluation if the symptoms worsen or if the condition fails to improve as anticipated.  I provided 25 minutes of non-face-to-face time during this encounter.   Neysa Hotter, MD    [facility] MD/PA/NP OP Progress Note  09/14/2023 10:41 AM Amy Leblanc  MRN:  [id number]  Chief Complaint:  Chief Complaint  Patient presents with   Follow-up   HPI:  This is a follow-up appointment for bipolar disorder, anxiety.  She states that it has been going.  On further elaboration, she states that she was found to have another hernia.  She feels like it is like PTSD, and this causes her significant anxiety.  She spent 2 months in the hospital several years ago when she had a surgery.  She is concerned that something will happen again.  Her father keeps falling, and she is concerned about him.  She also reportedly was found to have some cyst in precardiac area.  She has back pain.  She feels that things forced down around her.  Although she has difficulty in concentration, she has been able to keep things at work, which also helps to keep herself busy.  She thinks her sleep has been better.  She takes half tablet of Ativan every day.   Although she has good appetite, she sometimes feel nausea, and food gets stuck.  She denies SI.  She denies alcohol use or drug use.  She has not had any headache for a while.  She has an upcoming appointment with her provider for migraine in a few weeks.  She is willing to adjust the medication as outlined as below.   Visit Diagnosis:    ICD-10-CM   1. Bipolar affective disorder, currently depressed, mild (HCC)  F31.31     2. GAD (generalized anxiety disorder) [F41.1]  F41.1     3. Panic attack  F41.0     4. Screening for iron deficiency anemia  Z13.0       Past Psychiatric History: Please see initial evaluation for full details. I have reviewed the history. No updates at this time.     Past Medical History:  Past Medical History:  Diagnosis Date   ADHD (attention deficit hyperactivity disorder)    Anxiety    Bipolar disorder (HCC)    Depression     Past Surgical History:  Procedure Laterality Date   ABDOMINAL HYSTERECTOMY     HERNIA REPAIR     KIDNEY SURGERY Right    KNEE ARTHROSCOPY Bilateral    WRIST SURGERY Left     Family Psychiatric History: Please see initial evaluation for full details. I have reviewed the history. No updates at this time.     Family History:  Family History  Problem Relation Age of Onset  Anxiety disorder Mother    Depression Mother    Schizophrenia Maternal Grandmother     Social History:  Social History   Socioeconomic History   Marital status: Married    Spouse name: greg   Number of children: 1   Years of education: Not on file   Highest education level: Some college, no degree  Occupational History   Not on file  Tobacco Use   Smoking status: Never   Smokeless tobacco: Never  Vaping Use   Vaping status: Never Used  Substance and Sexual Activity   Alcohol use: Not Currently   Drug use: Never   Sexual activity: Yes  Other Topics Concern   Not on file  Social History Narrative   Not on file   Social Determinants of  Health   Financial Resource Strain: Medium Risk (10/15/2020)   Received from [facility], [facility]   Overall Financial Resource Strain (CARDIA)    Difficulty of Paying Living Expenses: Somewhat hard  Food Insecurity: No Food Insecurity (10/15/2020)   Received from [facility], [facility]   Hunger Vital Sign    Worried About Running Out of Food in the Last Year: Never true    Ran Out of Food in the Last Year: Never true  Transportation Needs: No Transportation Needs (10/15/2020)   Received from [facility], [facility]   [facility] - Transportation    Lack of Transportation (Medical): No    Lack of Transportation (Non-Medical): No  Physical Activity: Sufficiently Active (11/28/2018)   Exercise Vital Sign    Days of Exercise per Week: 6 days    Minutes of Exercise per Session: 60 min  Stress: Stress Concern Present (11/28/2018)   [employer] of Occupational Health - Occupational Stress Questionnaire    Feeling of Stress : Rather much  Social Connections: Unknown (01/12/2019)   Received from [facility], [facility]   Social Connection and Isolation Panel [NHANES]    Frequency of Communication with Friends and Family: Not on file    Frequency of Social Gatherings with Friends and Family: Not on file    Attends Religious Services: Not on file    Active Member of Clubs or Organizations: Not on file    Attends Banker Meetings: Not on file    Marital Status: Married    Allergies:  Allergies  Allergen Reactions   Penicillins Hives, Itching and Swelling    And itching    Ciprofloxacin Itching   Morphine Sulfate Itching   Sulfa Antibiotics Itching and Nausea And Vomiting   Tape Itching and Other (See Comments)    Welts & skin peeling off Welts & skin peeling off    Metabolic Disorder Labs: No results found for: "HGBA1C", "MPG" No results found for: "PROLACTIN" No results found for: "CHOL", "TRIG", "HDL", "CHOLHDL", "VLDL",  "LDLCALC" Lab Results  Component Value Date   TSH 2.271 12/30/2022    Therapeutic Level Labs: No results found for: "LITHIUM" No results found for: "VALPROATE" No results found for: "CBMZ"  Current Medications: Current Outpatient Medications  Medication Sig Dispense Refill   amitriptyline (ELAVIL) 150 MG tablet Take 1 tablet (150 mg total) by mouth at bedtime. 30 tablet 1   amitriptyline (ELAVIL) 100 MG tablet Take 100 mg by mouth at bedtime.     lamoTRIgine (LAMICTAL) 200 MG tablet Take 1 tablet (200 mg total) by mouth 2 (two) times daily. 60 tablet 5   [START ON 09/16/2023]  LORazepam (ATIVAN) 2 MG tablet Take 1 tablet (2 mg total) by mouth daily as needed for anxiety. 30 tablet 1   No current facility-administered medications for this visit.     Musculoskeletal: Strength & Muscle Tone:  N/A Gait & Station:  N/A Patient leans: N/A  Psychiatric Specialty Exam: Review of Systems  Psychiatric/Behavioral:  Positive for dysphoric mood and sleep disturbance. Negative for agitation, behavioral problems, confusion, decreased concentration, hallucinations, self-injury and suicidal ideas. The patient is nervous/anxious. The patient is not hyperactive.   All other systems reviewed and are negative.   There were no vitals taken for this visit.There is no height or weight on file to calculate BMI.  General Appearance: Well Groomed  Eye Contact:  Good  Speech:  Clear and Coherent  Volume:  Normal  Mood:  Anxious  Affect:  Appropriate, Congruent, and Full Range  Thought Process:  Coherent  Orientation:  Full (Time, Place, and Person)  Thought Content: Logical   Suicidal Thoughts:  No  Homicidal Thoughts:  No  Memory:  Immediate;   Good  Judgement:  Good  Insight:  Good  Psychomotor Activity:  Normal  Concentration:  Concentration: Good and Attention Span: Good  Recall:  Good  Fund of Knowledge: Good  Language: Good  Akathisia:   No  Handed:  Right  AIMS (if indicated): not  done  Assets:  Communication Skills Desire for Improvement  ADL's:  Intact  Cognition: WNL  Sleep:  Fair   Screenings: GAD-7    Garment/textile technologist Visit from 12/30/2022 in [facility] Visit from 11/02/2022 in [facility] Visit from 09/09/2022 in [facility] Visit from 07/15/2022 in [facility] Visit from 06/14/2022 in [facility]  Total GAD-7 Score 12 11 12 11 21       PHQ2-9    Flowsheet Row Office Visit from 12/30/2022 in [facility] Visit from 11/02/2022 in [facility] Office Visit from 09[facility]09/2022 in [facility] Office Visit from 07[facility]15/2022 in [facility] Office Visit from 06/14/2022 in [facility]  PHQ-2 Total Score 2 2 2 2 4   PHQ-9 Total Score 6 9 10 7 14         Assessment and Plan:  Amy Leblanc is a 52 y.o. year old female with a history of bipolar disorder, ADHD, insomnia, s/p gastric surgery, CKD stage 3 s/p nephrectomy, who presents for follow up appointment for below.   1. Bipolar affective disorder, currently depressed, mild (HCC) 2. GAD (generalized anxiety disorder) [F41.1] 3. Panic attack Acute stressors include: her father in relationship, and he has elevated PSA, concern about left flank mass, CKD, hernia surgery  Other stressors include: loss of her mother, her son with autism    History:     There has been worsening in anxiety, depressive symptoms in the context of stressors as above.  Will plan to uptitrate amitriptyline for depression, anxiety, which she has been on for migraine after she sees her provider.  Will taper off  sertraline at this time to avoid the risk of serotonin syndrome.  If this does not work, may consider tapering off amitriptyline and trying other antidepressants such as fluoxetine.  Noted that pharmacological treatment option is limited due to her having adverse reaction from other medication.  Will continue lamotrigine for bipolar disorder.  Will continue clonazepam as needed for anxiety.   4. Screening for iron deficiency anemia Ferritin level was marginally low in Feb.  She has not been taking any iron supplement.  She was advised again to obtain labs.   Plan  Continue lamotrigine 200 mg twice a day -charles drew Increase amitriptyline 150 mg at night  Decrease sertraline 100 mg daily for one week, then 50 mg daily for one week, then discontinue Continue lorazepam 2 mg daily as needed for extreme anxiety - [facility] in [place]  Obtain lab- iron panel at [place] Next appointment: 12/18 at 4 pm for 30 mins, video - on amitriptyline 100 mg at night for migraine   Past trials of medication: Buspar (limited benefit), bupropion (limited benefit), lithium, Depakote, lamotrigine, olanzapine, Abilify, Geodon, quetiapine (weight gain),  risperidone (nausea), Latuda (weight gain) (unable to afford Vraylar)   The patient demonstrates the following risk factors for suicide: Chronic risk factors for suicide include: The. Acute risk factors for suicide include: loss (financial, interpersonal, professional). Protective factors for this patient include: positive social support and hope for the future. Considering these factors, the overall suicide risk at this point appears to be low. Patient is appropriate for outpatient follow up.     Collaboration of Care: Collaboration of Care: Other reviewed notes in Epic  Patient/Guardian was advised Release of Information must be obtained prior to any record release in order to collaborate their care with an outside provider. Patient/Guardian was advised if they have  not already done so to contact the registration department to sign all necessary forms in order for [place] to release information regarding their care.   Consent: Patient/Guardian gives verbal consent for treatment and assignment of benefits for services provided during this visit. Patient/Guardian expressed understanding and agreed to proceed.    Neysa Hotter, MD 09/14/2023, 10:41 AM

## 2023-09-14 ENCOUNTER — Telehealth: Payer: Medicaid Other | Admitting: Psychiatry

## 2023-09-14 ENCOUNTER — Encounter: Payer: Self-pay | Admitting: Psychiatry

## 2023-09-14 DIAGNOSIS — Z130 Encounter for screening for diseases of the blood and blood-forming organs and certain disorders involving the immune mechanism: Secondary | ICD-10-CM

## 2023-09-14 DIAGNOSIS — F3131 Bipolar disorder, current episode depressed, mild: Secondary | ICD-10-CM | POA: Diagnosis not present

## 2023-09-14 DIAGNOSIS — F410 Panic disorder [episodic paroxysmal anxiety] without agoraphobia: Secondary | ICD-10-CM

## 2023-09-14 DIAGNOSIS — F411 Generalized anxiety disorder: Secondary | ICD-10-CM

## 2023-09-14 MED ORDER — AMITRIPTYLINE HCL 150 MG PO TABS: 150.0000 mg | ORAL_TABLET | Freq: Every day | ORAL | 1 refills | Status: DC

## 2023-09-14 MED ORDER — LORAZEPAM 2 MG PO TABS: 2.0000 mg | ORAL_TABLET | Freq: Every day | ORAL | 1 refills | Status: DC | PRN

## 2023-09-14 NOTE — Patient Instructions (Signed)
Continue lamotrigine 200 mg twice a day  Increase amitriptyline 150 mg at night  Decrease sertraline 100 mg daily for one week, then 50 mg daily for one week, then discontinue Continue lorazepam 2 mg daily as needed for extreme anxiety Obtain lab- iron panel at [place] Next appointment: 12/18 at 4 pm

## 2023-10-30 NOTE — Progress Notes (Unsigned)
Virtual Visit via Video Note  I connected with Amy Leblanc on 11/02/23 at  4:00 PM EST by a video enabled telemedicine application and verified that I am speaking with the correct person using two identifiers.  Location: Patient: home Provider: office Persons participated in the visit- patient, provider    I discussed the limitations of evaluation and management by telemedicine and the availability of in person appointments. The patient expressed understanding and agreed to proceed.    I discussed the assessment and treatment plan with the patient. The patient was provided an opportunity to ask questions and all were answered. The patient agreed with the plan and demonstrated an understanding of the instructions.   The patient was advised to call back or seek an in-person evaluation if the symptoms worsen or if the condition fails to improve as anticipated.  I provided 25 minutes of non-face-to-face time during this encounter.   Neysa Hotter, MD    [facility] MD/PA/NP OP Progress Note  11/02/2023 4:36 PM Amy Leblanc  MRN:  [id number]  Chief Complaint:  Chief Complaint  Patient presents with   Follow-up   HPI:  This is a follow-up appointment for bipolar disorder, anxiety.  According to the chart review, the following events have occurred since the last visit: The patient was admitted for lumbar hernia repair on 10/24/2023.   She states that she was admitted due to hernia surgery.  The robot could not get into the scar, and the surgeon approached from the back.  She is experiencing abdominal pain.  She was having panic attacks as she is concerned due to her previous experience from surgery.  She had sepsis after having first hernia surgery for esophagus.  Although she was preparing for the worst, she is scared.  Her son and her husband has been very supportive.  She has not noticed any change since switching from sertraline to amitriptyline.  She has not noticed any drowsiness or any  side effect.  She sleeps fair.  She feels tired.  She is not doing well mentally due to depression and anxiety.  She denies SI.  Gabapentin has been helping for pain, and she took only a few dose of lorazepam.  She agrees with the plan as outlined below.    Substance use   Tobacco Alcohol Other substances/  Current   denies denies  Past   denies denies  Past Treatment            Household: husband, son Marital status:married Number of children: 25 yo son, mildly autistic. 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 Bipolar affective disorder, currently depressed, mild (HCC)  F31.31     2. GAD (generalized anxiety disorder) [F41.1]  F41.1     3. Panic attack  F41.0       Past Psychiatric History: Please see initial evaluation for full details. I have reviewed the history. No updates at this time.     Past Medical History:  Past Medical History:  Diagnosis Date   ADHD (attention deficit hyperactivity disorder)    Anxiety    Bipolar disorder (HCC)    Depression     Past Surgical History:  Procedure Laterality Date   ABDOMINAL HYSTERECTOMY     HERNIA REPAIR     KIDNEY SURGERY Right    KNEE ARTHROSCOPY Bilateral    WRIST SURGERY Left     Family Psychiatric History:  Please see initial evaluation for full details. I have reviewed the history. No updates at this time.     Family History:  Family History  Problem Relation Age of Onset   Anxiety disorder Mother    Depression Mother    Schizophrenia Maternal Grandmother     Social History:  Social History   Socioeconomic History   Marital status: Married    Spouse name: Amy Leblanc   Number of children: 1   Years of education: Not on file   Highest education level: Some college, no degree  Occupational History   Not on file  Tobacco Use   Smoking status: Never   Smokeless tobacco: Never  Vaping Use   Vaping status: Never Used   Substance and Sexual Activity   Alcohol use: Not Currently   Drug use: Never   Sexual activity: Yes  Other Topics Concern   Not on file  Social History Narrative   Not on file   Social Drivers of Health   Financial Resource Strain: Medium Risk (10/15/2020)   [facility]   Overall Financial Resource Strain (CARDIA)    Difficulty of Paying Living Expenses: Somewhat hard  Food Insecurity: No Food Insecurity (10/15/2020)   Received from [facility], [facility]   Hunger Vital Sign    Worried About Running Out of Food in the Last Year: Never true    Ran Out of Food in the Last Year: Never true  Transportation Needs: No Transportation Needs (10/15/2020)   [facility] from [facility], [facility]   PRAPARE - Transportation    Lack of Transportation (Medical): No    Lack of Transportation (Non-Medical): No  Physical Activity: Sufficiently Active (11/28/2018)   Exercise Vital Sign    Days of Exercise per Week: 6 days    Minutes of Exercise per Session: 60 min  Stress: Stress Concern Present (11/28/2018)   [employer] of Occupational Health - Occupational Stress Questionnaire    Feeling of Stress : Rather much  Social Connections: Unknown (01/12/2019)   Received from [facility], [facility]   Social Connection and Isolation Panel [NHANES]    Frequency of Communication with Friends and Family: Not on file    Frequency of Social Gatherings with Friends and Family: Not on file    Attends Religious Services: Not on file    Active Member of Clubs or Organizations: Not on file    Attends Banker Meetings: Not on file    Marital Status: Married    Allergies:  Allergies  Allergen Reactions   Penicillins Hives, Itching and Swelling    And itching    Ciprofloxacin Itching   Morphine Sulfate Itching   Sulfa Antibiotics Itching and Nausea And Vomiting   Tape Itching and Other (See Comments)    Welts & skin peeling  off Welts & skin peeling off    Metabolic Disorder Labs: No results found for: "HGBA1C", "MPG" No results found for: "PROLACTIN" No results found for: "CHOL", "TRIG", "HDL", "CHOLHDL", "VLDL", "LDLCALC" Lab Results  Component Value Date   TSH 2.271 12/30/2022    Therapeutic Level Labs: No results found for: "LITHIUM" No results found for: "VALPROATE" No results found for: "CBMZ"  Current Medications: Current Outpatient Medications  Medication Sig Dispense Refill   gabapentin (NEURONTIN) 100 MG capsule Take 100 mg by mouth 3 (three) times daily.     [START ON 11/13/2023] amitriptyline (ELAVIL) 150 MG tablet Take  1 tablet (150 mg total) by mouth at bedtime. 30 tablet 1   lamoTRIgine (LAMICTAL) 200 MG tablet Take 1 tablet (200 mg total) by mouth 2 (two) times daily. 60 tablet 5   [START ON 11/17/2023] LORazepam (ATIVAN) 2 MG tablet Take 1 tablet (2 mg total) by mouth daily as needed for anxiety. 30 tablet 1   No current facility-administered medications for this visit.     Musculoskeletal: Strength & Muscle Tone:  N/A Gait & Station:  N/A Patient leans: N/A  Psychiatric Specialty Exam: Review of Systems  Psychiatric/Behavioral:  Positive for dysphoric mood and sleep disturbance. Negative for agitation, behavioral problems, confusion, decreased concentration, hallucinations, self-injury and suicidal ideas. The patient is nervous/anxious. The patient is not hyperactive.   All other systems reviewed and are negative.   There were no vitals taken for this visit.There is no height or weight on file to calculate BMI.  General Appearance: Well Groomed  Eye Contact:  Good  Speech:  Clear and Coherent  Volume:  Normal  Mood:   scared  Affect:  Appropriate, Congruent, and fatigue  Thought Process:  Coherent  Orientation:  Full (Time, Place, and Person)  Thought Content: Logical   Suicidal Thoughts:  No  Homicidal Thoughts:  No  Memory:  Immediate;   Good  Judgement:  Good   Insight:  Good  Psychomotor Activity:  Normal  Concentration:  Concentration: Good and Attention Span: Good  Recall:  Good  Fund of Knowledge: Good  Language: Good  Akathisia:  No  Handed:  Right  AIMS (if indicated): not done  Assets:  Communication Skills Desire for Improvement  ADL's:  Intact  Cognition: WNL  Sleep:  Fair   Screenings: GAD-7    Garment/textile technologist Visit from 12/30/2022 in [facility] Office Visit from 11/02/2022 in [facility] Office Visit from 09/09/2022 in [facility] Office Visit from 07/15/2022 in [facility] Office Visit from 06/14/2022 in [facility]  Total GAD-7 Score 12 11 12 11 21       PHQ2-9    Flowsheet Row Office Visit from 12/30/2022 in [facility] Office Visit from 11/02/2022 in [facility] Visit from 09/09/2022 in [facility] Visit from 07/15/2022 in [facility] Visit from 06/14/2022 in [facility]  PHQ-2 Total Score 2 2 2 2 4   PHQ-9 Total Score 6 9 10 7 14         Assessment and Plan:  Belky Coltharp is a 53 y.o. year old female with a history of bipolar disorder, ADHD, insomnia, s/p gastric surgery, CKD stage 3 s/p nephrectomy, who presents for follow up appointment for below.   1. Bipolar affective disorder, currently depressed, mild (HCC) 2. GAD (generalized anxiety disorder) [F41.1] 3. Panic attack Acute stressors include: her father in relationship, and he has elevated PSA, concern about left flank mass, CKD, hernia surgery  Other stressors include: loss of her mother, her son with autism    History:      She  continues to experience depressive symptoms and anxiety.  Will continue current medication regimen for now given that it is difficult to assess in the context of s/p hernia surgery.  Will continue current dose of amitriptyline at this time to target depression and anxiety, and migraine.  Will continue lamotrigine for bipolar disorder.  Will continue clonazepam as needed for anxiety.  Discussed potential risk of oversedation and respiratory suppression with concomitant use of gabapentin.    4. Screening for iron deficiency anemia Ferritin level was marginally low in Feb.  She has not been taking any iron supplement.  Plan to recheck at her next visit   Plan  Continue lamotrigine 200 mg twice a day -charles drew Continue amitriptyline 150 mg at night  Continue lorazepam 2 mg daily as needed for extreme anxiety - [facility] in [place]  Obtain lab- iron panel at  at her next visit Next appointment: 2/12 at 3 pm, video - on gabapentin 100 mg qidprn for pain   Past trials of medication: Buspar (limited benefit), bupropion (limited benefit), lithium, Depakote, lamotrigine, olanzapine, Abilify, Geodon, quetiapine (weight gain),  risperidone (nausea), Latuda (weight gain) (unable to afford Vraylar)   The patient demonstrates the following risk factors for suicide: Chronic risk factors for suicide include: The. Acute risk factors for suicide include: loss (financial, interpersonal, professional). Protective factors for this patient include: positive social support and hope for the future. Considering these factors, the overall suicide risk at this point appears to be low. Patient is appropriate for outpatient follow up.       Collaboration of Care: Collaboration of Care: Other reviewed notes in Epic  Patient/Guardian was advised Release of Information must be obtained prior to any record release in order to collaborate their care with an outside provider. Patient/Guardian was advised if they have  not already done so to contact the registration department to sign all necessary forms in order for [place] to release information regarding their care.   Consent: Patient/Guardian gives verbal consent for treatment and assignment of benefits for services provided during this visit. Patient/Guardian expressed understanding and agreed to proceed.    Neysa Hotter, MD 11/02/2023, 4:36 PM

## 2023-11-02 ENCOUNTER — Encounter: Payer: Self-pay | Admitting: Psychiatry

## 2023-11-02 ENCOUNTER — Telehealth (INDEPENDENT_AMBULATORY_CARE_PROVIDER_SITE_OTHER): Payer: Medicaid Other | Admitting: Psychiatry

## 2023-11-02 DIAGNOSIS — F410 Panic disorder [episodic paroxysmal anxiety] without agoraphobia: Secondary | ICD-10-CM | POA: Diagnosis not present

## 2023-11-02 DIAGNOSIS — F3131 Bipolar disorder, current episode depressed, mild: Secondary | ICD-10-CM | POA: Diagnosis not present

## 2023-11-02 DIAGNOSIS — F411 Generalized anxiety disorder: Secondary | ICD-10-CM

## 2023-11-02 MED ORDER — LORAZEPAM 2 MG PO TABS: 2.0000 mg | ORAL_TABLET | Freq: Every day | ORAL | 1 refills | Status: DC | PRN

## 2023-11-02 MED ORDER — AMITRIPTYLINE HCL 150 MG PO TABS: 150.0000 mg | ORAL_TABLET | Freq: Every day | ORAL | 1 refills | Status: DC

## 2023-11-02 NOTE — Patient Instructions (Addendum)
Continue lamotrigine 200 mg twice a day  Continue amitriptyline 150 mg at night  Continue lorazepam 2 mg daily as needed for extreme anxiety  Next appointment: 2/12 at 3 pm

## 2023-11-27 ENCOUNTER — Other Ambulatory Visit: Payer: Self-pay | Admitting: Psychiatry

## 2023-11-28 MED ORDER — LORAZEPAM 2 MG PO TABS: 2.0000 mg | ORAL_TABLET | Freq: Every day | ORAL | 0 refills | Status: AC | PRN

## 2023-11-28 NOTE — Telephone Encounter (Signed)
 Spoke to patient she is requesting that you send her Lorazepam to  [facility] pharmacy it was previously sent to [facility] called to have it cancelled spoke to Stephen he stated that he would cancel the Lorazepam.

## 2023-12-24 NOTE — Progress Notes (Signed)
Virtual Visit via Video Note  I connected with Amy Leblanc on 12/28/23 at  3:00 PM EST by a video enabled telemedicine application and verified that I am speaking with the correct person using two identifiers.  Location: Patient: home Provider: office Persons participated in the visit- patient, provider    I discussed the limitations of evaluation and management by telemedicine and the availability of in person appointments. The patient expressed understanding and agreed to proceed.    I discussed the assessment and treatment plan with the patient. The patient was provided an opportunity to ask questions and all were answered. The patient agreed with the plan and demonstrated an understanding of the instructions.   The patient was advised to call back or seek an in-person evaluation if the symptoms worsen or if the condition fails to improve as anticipated.   Amy Hotter, MD    [facility] MD/PA/NP OP Progress Note  12/28/2023 3:40 PM Amy Leblanc  MRN:  [id number]  Chief Complaint:  Chief Complaint  Patient presents with   Follow-up   HPI:  This is a follow-up appointment for bipolar disorder, anxiety. She states that she is not doing good.  Her father is in hospice care.  Somebody need to be with him, and she is currently at his house.  She states that she is daddy's girl, and reports very close relationship since childhood.  Although she has family, she feels lonely as he is the only family member.  She is worried what would happen.  She is also worried about keeping her job.  She has crying spells, has insomnia and appetite loss.  She reports frustration that her husband is not understanding.  She was on gabapentin briefly after surgery.  She noticed it help for anxiety to some extent.  She is willing to be back on this medication.  She denies SI.  She denies decreased need for sleep or euphoria.  She denies alcohol use or drug use.   Substance use   Tobacco Alcohol Other  substances/  Current   denies denies  Past   denies denies  Past Treatment            Household: husband, son Marital status:married Number of children: 32 yo son, mildly autistic. 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 Iron deficiency  E61.1 Iron and TIBC([facility])    Ferritin    2. Bipolar affective disorder, currently depressed, mild (HCC)  F31.31     3. GAD (generalized anxiety disorder) [F41.1]  F41.1     4. Panic attack  F41.0     5. High risk medication use  Z79.899 Urine Drug Panel 7      Past Psychiatric History: Please see initial evaluation for full details. I have reviewed the history. No updates at this time.     Past Medical History:  Past Medical History:  Diagnosis Date   ADHD (attention deficit hyperactivity disorder)    Anxiety    Bipolar disorder (HCC)    Depression     Past Surgical History:  Procedure Laterality Date   ABDOMINAL HYSTERECTOMY     HERNIA REPAIR     KIDNEY SURGERY Right    KNEE ARTHROSCOPY Bilateral    WRIST SURGERY Left     Family Psychiatric History: Please see initial evaluation for full details. I have reviewed the history. No updates at this time.  Family History:  Family History  Problem Relation Age of Onset   Anxiety disorder Mother    Depression Mother    Schizophrenia Maternal Grandmother     Social History:  Social History   Socioeconomic History   Marital status: Married    Spouse name: greg   Number of children: 1   Years of education: Not on file   Highest education level: Some college, no degree  Occupational History   Not on file  Tobacco Use   Smoking status: Never   Smokeless tobacco: Never  Vaping Use   Vaping status: Never Used  Substance and Sexual Activity   Alcohol use: Not Currently   Drug use: Never   Sexual activity: Yes  Other Topics Concern   Not on file  Social History  Narrative   Not on file   Social Drivers of Health   Financial Resource Strain: Medium Risk (10/15/2020)   Received from [facility], [facility]   Overall Financial Resource Strain (CARDIA)    Difficulty of Paying Living Expenses: Somewhat hard  Food Insecurity: No Food Insecurity (10/15/2020)   Received from [facility], [facility]   Hunger Vital Sign    Worried About Running Out of Food in the Last Year: Never true    Ran Out of Food in the Last Year: Never true  Transportation Needs: No Transportation Needs (10/15/2020)   Received from [facility], [facility]   PRAPARE - Transportation    Lack of Transportation (Medical): No    Lack of Transportation (Non-Medical): No  Physical Activity: Sufficiently Active (11/28/2018)   Exercise Vital Sign    Days of Exercise per Week: 6 days    Minutes of Exercise per Session: 60 min  Stress: Stress Concern Present (11/28/2018)   [employer] of Occupational Health - Occupational Stress Questionnaire    Feeling of Stress : Rather much  Social Connections: Unknown (01/12/2019)   Received from [facility], [facility]   Social Connection and Isolation Panel [NHANES]    Frequency of Communication with Friends and Family: Not on file    Frequency of Social Gatherings with Friends and Family: Not on file    Attends Religious Services: Not on file    Active Member of Clubs or Organizations: Not on file    Attends Banker Meetings: Not on file    Marital Status: Married    Allergies:  Allergies  Allergen Reactions   Penicillins Hives, Itching and Swelling    And itching    Ciprofloxacin Itching   Morphine Sulfate Itching   Sulfa Antibiotics Itching and Nausea And Vomiting   Tape Itching and Other (See Comments)    Welts & skin peeling off Welts & skin peeling off    Metabolic Disorder Labs: No results found for: "HGBA1C", "MPG" No results found for: "PROLACTIN" No results found for:  "CHOL", "TRIG", "HDL", "CHOLHDL", "VLDL", "LDLCALC" Lab Results  Component Value Date   TSH 2.271 12/30/2022    Therapeutic Level Labs: No results found for: "LITHIUM" No results found for: "VALPROATE" No results found for: "CBMZ"  Current Medications: Current Outpatient Medications  Medication Sig Dispense Refill   gabapentin (NEURONTIN) 100 MG capsule Take 1 capsule (100 mg total) by mouth 3 (three) times daily. 90 capsule 1   [START ON 01/12/2024] amitriptyline (ELAVIL) 150 MG tablet Take 1 tablet (150 mg total) by mouth at bedtime. 30 tablet 3   gabapentin (NEURONTIN) 100  MG capsule Take 100 mg by mouth 3 (three) times daily.     [START ON 01/23/2024] lamoTRIgine (LAMICTAL) 200 MG tablet Take 1 tablet (200 mg total) by mouth 2 (two) times daily. 60 tablet 5   LORazepam (ATIVAN) 2 MG tablet Take 1 tablet (2 mg total) by mouth daily as needed for anxiety. 30 tablet 0   LORazepam (ATIVAN) 2 MG tablet Take 1 tablet (2 mg total) by mouth daily as needed for anxiety. 30 tablet 1   No current facility-administered medications for this visit.     Musculoskeletal: Strength & Muscle Tone:  N/A Gait & Station:  N/A Patient leans: N/A  Psychiatric Specialty Exam: Review of Systems  Psychiatric/Behavioral:  Positive for dysphoric mood and sleep disturbance. Negative for agitation, behavioral problems, confusion, decreased concentration, hallucinations, self-injury and suicidal ideas. The patient is nervous/anxious. The patient is not hyperactive.   All other systems reviewed and are negative.   There were no vitals taken for this visit.There is no height or weight on file to calculate BMI.  General Appearance: Well Groomed  Eye Contact:  Good  Speech:  Clear and Coherent  Volume:  Normal  Mood:   overwhelmed  Affect:  Appropriate, Congruent, and Tearful  Thought Process:  Coherent  Orientation:  Full (Time, Place, and Person)  Thought Content: Logical   Suicidal Thoughts:  No   Homicidal Thoughts:  No  Memory:  Immediate;   Good  Judgement:  Good  Insight:  Good  Psychomotor Activity:  Normal  Concentration:  Concentration: Good and Attention Span: Good  Recall:  Good  Fund of Knowledge: Good  Language: Good  Akathisia:  No  Handed:  Right  AIMS (if indicated): not done  Assets:  Communication Skills Desire for Improvement  ADL's:  Intact  Cognition: WNL  Sleep:  Poor   Screenings: GAD-7    Flowsheet Row Office Visit from 12/30/2022 in [facility] Office Visit from 11/02/2022 in [facility] Visit from 09/09/2022 in [facility] Visit from 07/15/2022 in [facility] Office Visit from 06/14/2022 in [facility]  Total GAD-7 Score 12 11 12 11 21       PHQ2-9    Flowsheet Row Office Visit from 12/30/2022 in [facility] Office Visit from 11/02/2022 in [facility] Office Visit from 09/09/2022 in [facility] Office Visit from 07/15/2022 in [facility] Office Visit from 06/14/2022 in [facility]  PHQ-2 Total Score 2 2 2 2 4   PHQ-9 Total Score 6 9 10 7 14         Assessment and Plan:  Evey Mcmahan is a 53 y.o. year old female with a history of bipolar disorder, ADHD, insomnia, s/p gastric surgery, CKD stage 3 s/p nephrectomy, who presents for follow up appointment for below.     2. Bipolar affective disorder, currently depressed, mild (HCC) 3. GAD (generalized anxiety disorder) [F41.1] 4. Panic attack Acute stressors include: her father in relationship, and he has elevated PSA, concern about left flank mass, CKD, hernia surgery  Other stressors  include: loss of her mother, her son with autism    History:  originally on lamotrigine 200 mg BID, amitriptyline 75 mg at night, Buspar 30 mg BID, lorazepam 1 mg dailyprn  There is worsening in depressive  symptoms and anxiety in the context of taking care of her father, who is now in hospice care.  Given she reports some benefit for anxiety from gabapentin, will restart this medication while cautiously uses given CKD.  Discussed potential risk of respiratory suppression, risk of fall and confusion with concomitant use of lorazepam.  She agrees to limit use of lorazepam when able.  The hope is to taper off this medication as her mood improves.  Will continue current dose of amitriptyline to target depression, anxiety, migraine along with lamotrigine for bipolar disorder.   # high risk medication use  Obtain UDS given she is on lorazepam, gabapentin.   1. Iron deficiency Ferritin level was low in last Feb. we obtain lab to monitor this.    Plan  Continue lamotrigine 200 mg twice a day -charles drew Continue amitriptyline 150 mg at night  Start gabapentin 100 mg TID, GFR in Feb in 50's Continue lorazepam 2 mg daily as needed for extreme anxiety   Obtain lab, iron panels, UDS at [facility] Next appointment: 4/9 at 4 pm, video   Past trials of medication: Buspar (limited benefit), bupropion (limited benefit), lithium, Depakote, lamotrigine, olanzapine, Abilify, Geodon, quetiapine (weight gain),  risperidone (nausea), Latuda (weight gain) (unable to afford Vraylar)   The patient demonstrates the following risk factors for suicide: Chronic risk factors for suicide include: The. Acute risk factors for suicide include: loss (financial, interpersonal, professional). Protective factors for this patient include: positive social support and hope for the future. Considering these factors, the overall suicide risk at this point appears to be low. Patient is appropriate for outpatient follow up.      Collaboration of Care: Collaboration of Care: Other reviewed notes in Epic  Patient/Guardian was advised Release of Information must be obtained prior to any record release in order to collaborate their care with an outside provider. Patient/Guardian was advised if they have not already done so to contact the registration department to sign all necessary forms in order for [place] to release information regarding their care.   Consent: Patient/Guardian gives verbal consent for treatment and assignment of benefits for services provided during this visit. Patient/Guardian expressed understanding and agreed to proceed.    Amy Hotter, MD 12/28/2023, 3:40 PM

## 2023-12-28 ENCOUNTER — Telehealth (INDEPENDENT_AMBULATORY_CARE_PROVIDER_SITE_OTHER): Payer: Self-pay | Admitting: Psychiatry

## 2023-12-28 ENCOUNTER — Encounter: Payer: Self-pay | Admitting: Psychiatry

## 2023-12-28 DIAGNOSIS — F411 Generalized anxiety disorder: Secondary | ICD-10-CM

## 2023-12-28 DIAGNOSIS — F410 Panic disorder [episodic paroxysmal anxiety] without agoraphobia: Secondary | ICD-10-CM

## 2023-12-28 DIAGNOSIS — E611 Iron deficiency: Secondary | ICD-10-CM

## 2023-12-28 DIAGNOSIS — Z79899 Other long term (current) drug therapy: Secondary | ICD-10-CM

## 2023-12-28 DIAGNOSIS — F3131 Bipolar disorder, current episode depressed, mild: Secondary | ICD-10-CM

## 2023-12-28 MED ORDER — LAMOTRIGINE 200 MG PO TABS: 200.0000 mg | ORAL_TABLET | Freq: Two times a day (BID) | ORAL | 5 refills | Status: DC

## 2023-12-28 MED ORDER — GABAPENTIN 100 MG PO CAPS: 100.0000 mg | ORAL_CAPSULE | Freq: Three times a day (TID) | ORAL | 1 refills | Status: DC

## 2023-12-28 MED ORDER — AMITRIPTYLINE HCL 150 MG PO TABS: 150.0000 mg | ORAL_TABLET | Freq: Every day | ORAL | 3 refills | Status: DC

## 2023-12-28 MED ORDER — LORAZEPAM 2 MG PO TABS
2.0000 mg | ORAL_TABLET | Freq: Every day | ORAL | 1 refills | Status: DC | PRN
Start: 2023-12-28 — End: 2024-02-14

## 2023-12-28 NOTE — Patient Instructions (Addendum)
Continue lamotrigine 200 mg twice a day  Continue amitriptyline 150 mg at night  Start gabapentin 100 mg TID Continue lorazepam 2 mg daily as needed for extreme anxiety   Obtain lab, iron panels at [facility] Next appointment: 4/9 at 4 pm

## 2024-02-10 NOTE — Progress Notes (Signed)
 Virtual Visit via Video Note  I connected with Amy Leblanc on 02/14/24 at  9:30 AM EDT by a video enabled telemedicine application and verified that I am speaking with the correct person using two identifiers.  Location: Patient: home Provider: office Persons participated in the visit- patient, provider    I discussed the limitations of evaluation and management by telemedicine and the availability of in person appointments. The patient expressed understanding and agreed to proceed.     I discussed the assessment and treatment plan with the patient. The patient was provided an opportunity to ask questions and all were answered. The patient agreed with the plan and demonstrated an understanding of the instructions.   The patient was advised to call back or seek an in-person evaluation if the symptoms worsen or if the condition fails to improve as anticipated.    Neysa Hotter, MD    [facility] MD/PA/NP OP Progress Note  02/14/2024 10:19 AM Amy Leblanc  MRN:  [id number]  Chief Complaint:  Chief Complaint  Patient presents with   Follow-up   HPI:  This is a follow-up appointment for bipolar disorder, anxiety.  She states that she states her father.  She cries to sleep.  Although she tries to hold herself during the visit, she thinks she will be crying after the visit.  She feels lost in who she is.  She was taking care of her mother, then her father.  She lives with him for a month.  She had to make her decisions.  She is now overwhelmed with things she needs to do including insurance policy.  Her husband does not understand her.  She also states that nobody at work has reached out to her.  Validated her grief and frustration.  She agrees that she feels lonely in this.  She has hyperventilation, and had to take lorazepam every day.  She denies any side effects from gabapentin, although has not noticed any benefit either.  She has insomnia.  She feels depressed.  She denies SI.  She will be  seeing a grief therapist in several days. She agrees with the following.  Substance use   Tobacco Alcohol Other substances/  Current   denies denies  Past   denies denies  Past Treatment            Household: [name]/husband, son Marital status:married Number of children: 8 yo son, mildly autistic. 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 Bipolar affective disorder, currently depressed, moderate (HCC)  F31.32     2. GAD (generalized anxiety disorder) [F41.1]  F41.1     3. Panic attack  F41.0     4. Iron deficiency  E61.1       Past Psychiatric History: Please see initial evaluation for full details. I have reviewed the history. No updates at this time.     Past Medical History:  Past Medical History:  Diagnosis Date   ADHD (attention deficit hyperactivity disorder)    Anxiety    Bipolar disorder (HCC)    Depression     Past Surgical History:  Procedure Laterality Date   ABDOMINAL HYSTERECTOMY     HERNIA REPAIR     KIDNEY SURGERY Right    KNEE ARTHROSCOPY Bilateral    WRIST SURGERY Left     Family Psychiatric History: Please see initial evaluation for full details. I have reviewed the history. No updates  at this time.     Family History:  Family History  Problem Relation Age of Onset   Anxiety disorder Mother    Depression Mother    Schizophrenia Maternal Grandmother     Social History:  Social History   Socioeconomic History   Marital status: Married    Spouse name: greg   Number of children: 1   Years of education: Not on file   Highest education level: Some college, no degree  Occupational History   Not on file  Tobacco Use   Smoking status: Never   Smokeless tobacco: Never  Vaping Use   Vaping status: Never Used  Substance and Sexual Activity   Alcohol use: Not Currently   Drug use: Never   Sexual activity: Yes  Other Topics Concern   Not on file   Social History Narrative   Not on file   Social Drivers of Health   Financial Resource Strain: Medium Risk (10/15/2020)   Received [facility], [facility]   Overall Financial Resource Strain (CARDIA)    Difficulty of Paying Living Expenses: Somewhat hard  Food Insecurity: No Food Insecurity (10/15/2020)   Received from [facility], [facility]   Hunger Vital Sign    Worried About Running Out of Food in the Last Year: Never true    Ran Out of Food in the Last Year: Never true  Transportation Needs: No Transportation Needs (10/15/2020)   Received from [facility], [facility]   PRAPARE - Transportation    Lack of Transportation (Medical): No    Lack of Transportation (Non-Medical): No  Physical Activity: Sufficiently Active (11/28/2018)   Exercise Vital Sign    Days of Exercise per Week: 6 days    Minutes of Exercise per Session: 60 min  Stress: Stress Concern Present (11/28/2018)   [employer] of Occupational Health - Occupational Stress Questionnaire    Feeling of Stress : Rather much  Social Connections: Unknown (01/12/2019)   Received from [facility], [facility]   Social Connection and Isolation Panel [NHANES]    Frequency of Communication with Friends and Family: Not on file    Frequency of Social Gatherings with Friends and Family: Not on file    Attends Religious Services: Not on file    Active Member of Clubs or Organizations: Not on file    Attends Banker Meetings: Not on file    Marital Status: Married    Allergies:  Allergies  Allergen Reactions   Penicillins Hives, Itching and Swelling    And itching    Ciprofloxacin Itching   Morphine Sulfate Itching   Sulfa Antibiotics Itching and Nausea And Vomiting   Tape Itching and Other (See Comments)    Welts & skin peeling off Welts & skin peeling off    Metabolic Disorder Labs: No results found for: "HGBA1C", "MPG" No results found for: "PROLACTIN" No  results found for: "CHOL", "TRIG", "HDL", "CHOLHDL", "VLDL", "LDLCALC" Lab Results  Component Value Date   TSH 2.271 12/30/2022    Therapeutic Level Labs: No results found for: "LITHIUM" No results found for: "VALPROATE" No results found for: "CBMZ"  Current Medications: Current Outpatient Medications  Medication Sig Dispense Refill   amitriptyline (ELAVIL) 150 MG tablet Take 1 tablet (150 mg total) by mouth at bedtime. 30 tablet 3   gabapentin (NEURONTIN) 100 MG capsule Take 100 mg by mouth 3 (three) times daily.     [START ON 02/26/2024]  gabapentin (NEURONTIN) 100 MG capsule Take 1 capsule (100 mg total) by mouth 3 (three) times daily. 90 capsule 0   lamoTRIgine (LAMICTAL) 200 MG tablet Take 1 tablet (200 mg total) by mouth 2 (two) times daily. 60 tablet 5   [START ON 03/02/2024] LORazepam (ATIVAN) 2 MG tablet Take 1 tablet (2 mg total) by mouth daily as needed for anxiety. 30 tablet 0   No current facility-administered medications for this visit.     Musculoskeletal: Strength & Muscle Tone:  N/A Gait & Station:  N/A Patient leans: N/A  Psychiatric Specialty Exam: Review of Systems  Psychiatric/Behavioral:  Positive for dysphoric mood and sleep disturbance. Negative for agitation, behavioral problems, confusion, decreased concentration, hallucinations, self-injury and suicidal ideas. The patient is nervous/anxious. The patient is not hyperactive.   All other systems reviewed and are negative.   There were no vitals taken for this visit.There is no height or weight on file to calculate BMI.  General Appearance: Well Groomed  Eye Contact:  Good  Speech:  Clear and Coherent  Volume:  Normal  Mood:  Depressed  Affect:  Appropriate, Congruent, and Tearful  Thought Process:  Coherent  Orientation:  Full (Time, Place, and Person)  Thought Content: Logical   Suicidal Thoughts:  No  Homicidal Thoughts:  No  Memory:  Immediate;   Good  Judgement:  Good  Insight:  Good   Psychomotor Activity:  Normal  Concentration:  Concentration: Good and Attention Span: Good  Recall:  Good  Fund of Knowledge: Good  Language: Good  Akathisia:  No  Handed:  Right  AIMS (if indicated): not done  Assets:  Communication Skills Desire for Improvement  ADL's:  Intact  Cognition: WNL  Sleep:  Poor   Screenings: GAD-7    Flowsheet Row Office Visit from 12/30/2022 in [facility] Visit from 11/02/2022 in [facility] Visit from 09/09/2022 in [facility] Office Visit from 07/15/2022 in [facility] Office Visit from 06/14/2022 in [facility]  Total GAD-7 Score 12 11 12 11 21       PHQ2-9    Flowsheet Row Office Visit from 12/30/2022 in [facility] Office Visit from 11/02/2022 in [facility] Office Visit from 09/09/2022 in [facility] Office Visit from 07/15/2022 in [facility] Office Visit from 06/14/2022 in [facility]  PHQ-2 Total Score 2 2 2 2 4   PHQ-9 Total Score 6 9 10 7 14         Assessment and Plan:  Amy Leblanc is a 53 y.o. year old female with a history of bipolar disorder, ADHD, insomnia, s/p gastric surgery, CKD stage 3 s/p nephrectomy, who presents for follow up appointment for below.   1. Bipolar affective disorder, currently depressed, moderate (HCC) 2. GAD (generalized anxiety disorder) [F41.1] 3. Panic attack Acute stressors include: loss of her father, concern about left flank mass, CKD, hernia surgery  Other stressors include: loss of her mother, her son with autism    History:  originally on lamotrigine 200 mg BID, amitriptyline 75 mg at  night, Buspar 30 mg BID, lorazepam 1 mg dailyprn  Significant worsening in depressive symptoms and anxiety in the context of loss of her father, who was in hospice care.  She agrees to not make adjustment at this  time, considering that this could be natural course of bereavement, and she has an upcoming appointment for grief therapy.  Will continue lamotrigine for bipolar disorder.  Will continue amitriptyline for depression, migraine, along with gabapentin for anxiety.  Will continue clonazepam as needed for anxiety.   4. Iron deficiency She was previously advised to obtain lab given ferritin for monitoring.   # high risk medication use  She was advised to obtain UDS.   Plan  Continue lamotrigine 200 mg twice a day -charles drew Continue amitriptyline 150 mg at night  Start gabapentin 100 mg TID, GFR in Feb in 50's Continue lorazepam 2 mg daily as needed for extreme anxiety   Obtain lab, iron panels, UDS at [facility] Next appointment: 4/9 at 4 pm, video   Past trials of medication: Buspar (limited benefit), bupropion (limited benefit), lithium, Depakote, lamotrigine, olanzapine, Abilify, Geodon, quetiapine (weight gain),  risperidone (nausea), Latuda (weight gain) (unable to afford Vraylar)   The patient demonstrates the following risk factors for suicide: Chronic risk factors for suicide include: The. Acute risk factors for suicide include: loss (financial, interpersonal, professional). Protective factors for this patient include: positive social support and hope for the future. Considering these factors, the overall suicide risk at this point appears to be low. Patient is appropriate for outpatient follow up.     Collaboration of Care: Collaboration of Care: Other reviewed notes in Epic  Patient/Guardian was advised Release of Information must be obtained prior to any record release in order to collaborate their care with an outside provider. Patient/Guardian was advised if they have not  already done so to contact the registration department to sign all necessary forms in order for [place] to release information regarding their care.   Consent: Patient/Guardian gives verbal consent for treatment and assignment of benefits for services provided during this visit. Patient/Guardian expressed understanding and agreed to proceed.    Neysa Hotter, MD 02/14/2024, 10:19 AM

## 2024-02-14 ENCOUNTER — Encounter: Payer: Self-pay | Admitting: Psychiatry

## 2024-02-14 ENCOUNTER — Telehealth (INDEPENDENT_AMBULATORY_CARE_PROVIDER_SITE_OTHER): Payer: Self-pay | Admitting: Psychiatry

## 2024-02-14 DIAGNOSIS — F3132 Bipolar disorder, current episode depressed, moderate: Secondary | ICD-10-CM

## 2024-02-14 DIAGNOSIS — E611 Iron deficiency: Secondary | ICD-10-CM

## 2024-02-14 DIAGNOSIS — F411 Generalized anxiety disorder: Secondary | ICD-10-CM

## 2024-02-14 DIAGNOSIS — F410 Panic disorder [episodic paroxysmal anxiety] without agoraphobia: Secondary | ICD-10-CM

## 2024-02-14 MED ORDER — GABAPENTIN 100 MG PO CAPS: 100.0000 mg | ORAL_CAPSULE | Freq: Three times a day (TID) | ORAL | 0 refills | Status: DC

## 2024-02-14 MED ORDER — LORAZEPAM 2 MG PO TABS: 2.0000 mg | ORAL_TABLET | Freq: Every day | ORAL | 0 refills | Status: DC | PRN

## 2024-02-22 ENCOUNTER — Telehealth: Payer: Self-pay | Admitting: Psychiatry

## 2024-03-07 ENCOUNTER — Other Ambulatory Visit: Payer: Self-pay | Admitting: Psychiatry

## 2024-03-07 MED ORDER — LORAZEPAM 2 MG PO TABS: 2.0000 mg | ORAL_TABLET | Freq: Every day | ORAL | 0 refills | Status: DC | PRN

## 2024-03-09 ENCOUNTER — Telehealth: Payer: Self-pay

## 2024-03-09 NOTE — Telephone Encounter (Signed)
 Had to leave a message with pharmacy to cancel the lorazepam .

## 2024-03-16 NOTE — Progress Notes (Unsigned)
 Virtual Visit via Video Note  I connected with Amy Leblanc on 03/21/24 at  4:00 PM EDT by a video enabled telemedicine application and verified that I am speaking with the correct person using two identifiers.  Location: Patient: work Provider: home office Persons participated in the visit- patient, provider    I discussed the limitations of evaluation and management by telemedicine and the availability of in person appointments. The patient expressed understanding and agreed to proceed.    I discussed the assessment and treatment plan with the patient. The patient was provided an opportunity to ask questions and all were answered. The patient agreed with the plan and demonstrated an understanding of the instructions.   The patient was advised to call back or seek an in-person evaluation if the symptoms worsen or if the condition fails to improve as anticipated.   Todd Fossa, MD    [facility] MD/PA/NP OP Progress Note  03/21/2024 5:17 PM Amy Leblanc  MRN:  [id number]  Chief Complaint:  Chief Complaint  Patient presents with   Follow-up   HPI:  This is a follow-up appointment for bipolar disorder and anxiety.  She states that she is having a rough week.  She has seen a grief counselor for the past 2 months.  She sees images of her father.  It was extremely traumatic, and rough to watch. She has never seen him that way. She states that there is triggers at work such as going to ICU.  She feels so fresh.  Her husband does not understand.  She states that she does not like her, although she acts like she is like her. She does not want people to see her a week, while she feels very weak.  She had to go to her father's condo the other day as her husband and her son was bickering over sock.  She feels empty, and feels that her head is exploding.  She has not noticed much difference since uptitration of gabapentin , although initially it helped for pain.  She has insomnia with nightmares.  She  denies SI.  She denies alcohol use or drug use.  She agrees with the plan as outlined below.   Substance use   Tobacco Alcohol Other substances/  Current   denies denies  Past   denies denies  Past Treatment            Household: [name]/husband, son Marital status:married Number of children: 13 yo son, mildly autistic. 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 Bipolar affective disorder, currently depressed, moderate (HCC)  F31.32     2. GAD (generalized anxiety disorder) [F41.1]  F41.1     3. Panic attack  F41.0     4. Iron deficiency  E61.1     5. High risk medication use  Z79.899 Drugs of abuse scrn w alc, routine urine      Past Psychiatric History: Please see initial evaluation for full details. I have reviewed the history. No updates at this time.     Past Medical History:  Past Medical History:  Diagnosis Date   ADHD (attention deficit hyperactivity disorder)    Anxiety    Bipolar disorder (HCC)    Depression     Past Surgical History:  Procedure Laterality Date   ABDOMINAL HYSTERECTOMY     HERNIA REPAIR     KIDNEY SURGERY Right    KNEE ARTHROSCOPY  Bilateral    WRIST SURGERY Left     Family Psychiatric History: Please see initial evaluation for full details. I have reviewed the history. No updates at this time.     Family History:  Family History  Problem Relation Age of Onset   Anxiety disorder Mother    Depression Mother    Schizophrenia Maternal Grandmother     Social History:  Social History   Socioeconomic History   Marital status: Married    Spouse name: greg   Number of children: 1   Years of education: Not on file   Highest education level: Some college, no degree  Occupational History   Not on file  Tobacco Use   Smoking status: Never   Smokeless tobacco: Never  Vaping Use   Vaping status: Never Used  Substance and Sexual Activity   Alcohol  use: Not Currently   Drug use: Never   Sexual activity: Yes  Other Topics Concern   Not on file  Social History Narrative   Not on file   Social Drivers of Health   Financial Resource Strain: Medium Risk (10/15/2020)   Received from [facility], [facility]   Overall Financial Resource Strain (CARDIA)    Difficulty of Paying Living Expenses: Somewhat hard  Food Insecurity: No Food Insecurity (10/15/2020)   Received from [facility], [facility]   Hunger Vital Sign    Worried About Running Out of Food in the Last Year: Never true    Ran Out of Food in the Last Year: Never true  Transportation Needs: No Transportation Needs (10/15/2020)   Received from [facility], [facility]   PRAPARE - Transportation    Lack of Transportation (Medical): No    Lack of Transportation (Non-Medical): No  Physical Activity: Sufficiently Active (11/28/2018)   Exercise Vital Sign    Days of Exercise per Week: 6 days    Minutes of Exercise per Session: 60 min  Stress: Stress Concern Present (11/28/2018)   [employer] of Occupational Health - Occupational Stress Questionnaire    Feeling of Stress : Rather much  Social Connections: Unknown (01/12/2019)   Received from [facility], [facility]   Social Connection and Isolation Panel [NHANES]    Frequency of Communication with Friends and Family: Not on file    Frequency of Social Gatherings with Friends and Family: Not on file    Attends Religious Services: Not on file    Active Member of Clubs or Organizations: Not on file    Attends Banker Meetings: Not on file    Marital Status: Married    Allergies:  Allergies  Allergen Reactions   Penicillins Hives, Itching and Swelling    And itching    Ciprofloxacin Itching   Morphine Sulfate Itching   Sulfa Antibiotics Itching and Nausea And Vomiting   Tape Itching and Other (See Comments)    Welts & skin peeling off Welts & skin peeling off     Metabolic Disorder Labs: No results found for: "HGBA1C", "MPG" No results found for: "PROLACTIN" No results found for: "CHOL", "TRIG", "HDL", "CHOLHDL", "VLDL", "LDLCALC" Lab Results  Component Value Date   TSH 2.271 12/30/2022    Therapeutic Level Labs: No results found for: "LITHIUM" No results found for: "VALPROATE" No results found for: "CBMZ"  Current Medications: Current Outpatient Medications  Medication Sig Dispense Refill   amitriptyline  (ELAVIL ) 150 MG tablet Take 1 tablet (150 mg total) by mouth  at bedtime. 30 tablet 1   lamoTRIgine  (LAMICTAL ) 200 MG tablet Take 1 tablet (200 mg total) by mouth 2 (two) times daily. 60 tablet 1   prazosin (MINIPRESS) 1 MG capsule Take 1 capsule (1 mg total) by mouth at bedtime for 3 days. 3 capsule 0   [START ON 03/24/2024] prazosin (MINIPRESS) 2 MG capsule Take 1 capsule (2 mg total) by mouth at bedtime. Start after completing 1 mg for 3 days 30 capsule 1   amitriptyline  (ELAVIL ) 150 MG tablet Take 1 tablet (150 mg total) by mouth at bedtime. 30 tablet 3   gabapentin  (NEURONTIN ) 100 MG capsule Take 300 mg by mouth 3 (three) times daily.     lamoTRIgine  (LAMICTAL ) 200 MG tablet Take 1 tablet (200 mg total) by mouth 2 (two) times daily. 60 tablet 5   [START ON 04/06/2024] LORazepam  (ATIVAN ) 2 MG tablet Take 1 tablet (2 mg total) by mouth daily as needed for anxiety. 30 tablet 0   No current facility-administered medications for this visit.     Musculoskeletal: Strength & Muscle Tone:  N/A Gait & Station:  N/A Patient leans: N/A  Psychiatric Specialty Exam: Review of Systems  Psychiatric/Behavioral:  Positive for decreased concentration, dysphoric mood and sleep disturbance. Negative for agitation, behavioral problems, confusion, hallucinations, self-injury and suicidal ideas. The patient is nervous/anxious. The patient is not hyperactive.   All other systems reviewed and are negative.   There were no vitals taken for this  visit.There is no height or weight on file to calculate BMI.  General Appearance: Well Groomed  Eye Contact:  Good  Speech:  Clear and Coherent  Volume:  Normal  Mood:  Anxious and Depressed  Affect:  Appropriate, Congruent, and Tearful  Thought Process:  Coherent  Orientation:  Full (Time, Place, and Person)  Thought Content: Logical   Suicidal Thoughts:  No  Homicidal Thoughts:  No  Memory:  Immediate;   Good  Judgement:  Good  Insight:  Good  Psychomotor Activity:  Normal  Concentration:  Concentration: Good and Attention Span: Good  Recall:  Good  Fund of Knowledge: Good  Language: Good  Akathisia:  No  Handed:  Right  AIMS (if indicated): not done  Assets:  Communication Skills Desire for Improvement  ADL's:  Intact  Cognition: WNL  Sleep:  Poor   Screenings: GAD-7    Flowsheet Row Office Visit from 12/30/2022 in [facility] Office Visit from 11/02/2022 in [facility] Visit from 09/09/2022 in [facility] Visit from 07/15/2022 in [facility] Visit from 06[facility]14/2022 in [facility]  Total GAD-7 Score 12 11 12 11 21       PHQ2-9    Flowsheet Row Office Visit from 12/30/2022 in [facility] Visit from 11/02/2022 in [facility] Visit from 09/09/2022 in [facility] Visit from 07/15/2022 in [facility] Visit from 06/14/2022 [facility]  PHQ-2 Total Score 2 2 2 2 4   PHQ-9 Total Score 6 9 10 7 14         Assessment and Plan:  Capucine Busenbark is a 53 y.o. year old female with a history of bipolar disorder, ADHD, insomnia, s/p gastric  surgery, CKD stage 3 s/p nephrectomy, who presents for follow up appointment for  below.   1. Bipolar affective disorder, currently depressed, moderate (HCC) 2. GAD (generalized anxiety disorder) [F41.1] 3. Panic attack Acute stressors include: loss of her father, concern about left flank mass, CKD, hernia surgery  Other stressors include: loss of her mother, her son with autism    History:  originally on lamotrigine  200 mg BID, amitriptyline  75 mg at night, Buspar  30 mg BID, lorazepam  1 mg dailyprn  She reports significant worsening in depressive symptoms and anxiety in the context of loss of her father, who was a hospice care.  She now sees a vivid images of him with a sense of being traumatized.  Will start prazosin to target nightmares and hyperarousal symptoms.  Discussed potential risk of orthostatic hypotension.  Will continue lamotrigine  for bipolar disorder.  Will continue amitriptyline  for depression and migraine.  Noted that gabapentin  was up titrated by her pain specialist; we will defer the medication management to them.  Will continue lorazepam  as needed for anxiety.  She will continue to see a therapist at palliative care for grief therapy.   4. Iron deficiency She was advised again to obtain labs for monitoring.   # high risk medication use  She was advised again to obtain UDS.    Plan - [facility] Continue lamotrigine  200 mg twice a day  Continue amitriptyline  150 mg at night  Start prazosin 1 mg at night for 3 days, then  2 mg at night Continue lorazepam  2 mg daily as needed for extreme anxiety   Obtain lab, iron panels, UDS at [facility] Next appointment: 6/20 at 11 am, video - on gabapentin  300 mg tid   Past trials of medication: Buspar  (limited benefit), bupropion  (limited benefit), lithium, Depakote, lamotrigine , olanzapine, Abilify, Geodon, quetiapine (weight gain),  risperidone (nausea), Latuda  (weight gain) (unable to afford Vraylar )   The patient demonstrates the  following risk factors for suicide: Chronic risk factors for suicide include: The. Acute risk factors for suicide include: loss (financial, interpersonal, professional). Protective factors for this patient include: positive social support and hope for the future. Considering these factors, the overall suicide risk at this point appears to be low. Patient is appropriate for outpatient follow up.   Collaboration of Care: Collaboration of Care: Other reviewed notes in Epic  Patient/Guardian was advised Release of Information must be obtained prior to any record release in order to collaborate their care with an outside provider. Patient/Guardian was advised if they have not already done so to contact the registration department to sign all necessary forms in order for us  to release information regarding their care.   Consent: Patient/Guardian gives verbal consent for treatment and assignment of benefits for services provided during this visit. Patient/Guardian expressed understanding and agreed to proceed.    Todd Fossa, MD 03/21/2024, 5:17 PM

## 2024-03-21 ENCOUNTER — Encounter: Payer: Self-pay | Admitting: Psychiatry

## 2024-03-21 ENCOUNTER — Telehealth: Payer: Self-pay | Admitting: Psychiatry

## 2024-03-21 DIAGNOSIS — Z79899 Other long term (current) drug therapy: Secondary | ICD-10-CM

## 2024-03-21 DIAGNOSIS — F411 Generalized anxiety disorder: Secondary | ICD-10-CM

## 2024-03-21 DIAGNOSIS — E611 Iron deficiency: Secondary | ICD-10-CM

## 2024-03-21 DIAGNOSIS — F3132 Bipolar disorder, current episode depressed, moderate: Secondary | ICD-10-CM

## 2024-03-21 DIAGNOSIS — F410 Panic disorder [episodic paroxysmal anxiety] without agoraphobia: Secondary | ICD-10-CM

## 2024-03-21 MED ORDER — LAMOTRIGINE 200 MG PO TABS: 200.0000 mg | ORAL_TABLET | Freq: Two times a day (BID) | ORAL | 1 refills | Status: DC

## 2024-03-21 MED ORDER — PRAZOSIN HCL 2 MG PO CAPS
2.0000 mg | ORAL_CAPSULE | Freq: Every day | ORAL | 1 refills | Status: DC
Start: 2024-03-24 — End: 2024-04-16

## 2024-03-21 MED ORDER — LORAZEPAM 2 MG PO TABS
2.0000 mg | ORAL_TABLET | Freq: Every day | ORAL | 0 refills | Status: DC | PRN
Start: 2024-04-06 — End: 2024-04-16

## 2024-03-21 MED ORDER — PRAZOSIN HCL 1 MG PO CAPS: 1.0000 mg | ORAL_CAPSULE | Freq: Every day | ORAL | 0 refills | Status: DC

## 2024-03-21 MED ORDER — AMITRIPTYLINE HCL 150 MG PO TABS: 150.0000 mg | ORAL_TABLET | Freq: Every day | ORAL | 1 refills | Status: DC

## 2024-03-21 NOTE — Patient Instructions (Signed)
 Continue lamotrigine  200 mg twice a day  Continue amitriptyline  150 mg at night  Start prazosin 1 mg at night for 3 days, then  2 mg at night Continue lorazepam  2 mg daily as needed for extreme anxiety   Obtain lab, iron panels, UDS at [facility] Next appointment: 6/20 at 11 am

## 2024-03-23 ENCOUNTER — Telehealth: Payer: Self-pay

## 2024-03-23 NOTE — Telephone Encounter (Signed)
 received fax that a prior auth was needed for the prazosin .

## 2024-03-23 NOTE — Telephone Encounter (Signed)
 went online to [email or web address] and submitted the prior auth . - pending

## 2024-03-25 ENCOUNTER — Other Ambulatory Visit: Payer: Self-pay | Admitting: Psychiatry

## 2024-04-03 ENCOUNTER — Telehealth: Payer: Self-pay | Admitting: *Deleted

## 2024-04-11 LAB — CBC WITH DIFF/PLATELET
EGFR (Non-African Amer.): 57
TSH: 4.14 (ref 0.41–5.90)

## 2024-04-16 ENCOUNTER — Telehealth (INDEPENDENT_AMBULATORY_CARE_PROVIDER_SITE_OTHER): Payer: Self-pay | Admitting: Psychiatry

## 2024-04-16 ENCOUNTER — Encounter: Payer: Self-pay | Admitting: Psychiatry

## 2024-04-16 DIAGNOSIS — F3132 Bipolar disorder, current episode depressed, moderate: Secondary | ICD-10-CM

## 2024-04-16 DIAGNOSIS — F410 Panic disorder [episodic paroxysmal anxiety] without agoraphobia: Secondary | ICD-10-CM

## 2024-04-16 DIAGNOSIS — F411 Generalized anxiety disorder: Secondary | ICD-10-CM

## 2024-04-16 MED ORDER — PRAZOSIN HCL 2 MG PO CAPS: 4.0000 mg | ORAL_CAPSULE | Freq: Every day | ORAL | 0 refills | Status: DC

## 2024-04-16 MED ORDER — LORAZEPAM 2 MG PO TABS: 2.0000 mg | ORAL_TABLET | Freq: Every day | ORAL | 0 refills | Status: DC | PRN

## 2024-04-16 NOTE — Patient Instructions (Signed)
 Continue lamotrigine  200 mg twice a day  Continue amitriptyline  150 mg at night  Increase prazosin  4 mg at night  Continue lorazepam  2 mg daily as needed for extreme anxiety   Obtain lab, iron panels, UDS at [facility] Next appointment: 7/9 at 4:30

## 2024-04-16 NOTE — Progress Notes (Addendum)
 Virtual Visit via Video Note  I connected with Amy Leblanc on 04/16/24 at  4:30 PM EDT by a video enabled telemedicine application and verified that I am speaking with the correct person using two identifiers.  Location: Patient: home Provider: office Persons participated in the visit- patient, provider    I discussed the limitations of evaluation and management by telemedicine and the availability of in person appointments. The patient expressed understanding and agreed to proceed.   I discussed the assessment and treatment plan with the patient. The patient was provided an opportunity to ask questions and all were answered. The patient agreed with the plan and demonstrated an understanding of the instructions.   The patient was advised to call back or seek an in-person evaluation if the symptoms worsen or if the condition fails to improve as anticipated.   Todd Fossa, MD   [facility] MD/PA/NP OP Progress Note  04/16/2024 5:31 PM Amy Leblanc  MRN:  [id number]  Chief Complaint:  Chief Complaint  Patient presents with   Follow-up   HPI:  This is a follow-up appointment for bipolar disorder, anxiety.  She states that she is doing the best she can.  She still goes to work as she has to go.  She feels like she just go with the motions.  She has been able to get things done, although she notices herself to be a little more irritable.  She has applied for disability for multiple reason.  She has chronic GI issues, severe low back pain, and she might need to have a surgery.  It is depressing.  She also has an upcoming appointment with cardiologist.  She also thinks mental health is not the best.  She tries to keep herself busy.  When she does not do things, she tends to think about her father.  She wishes to get images out, as she has vivid sound memories of the time he was struggling,  She is aware that she is concerned by this, and may not be present with her family.  She went to the beach with  her son and her husband, where he used to love to stay.  It was hard for her to leave.  She does not want her family to feel that she does not care.  She agreed to set aside time to reflect on her father while also making an effort to be more present with her family.  She has not noticed any difference since starting prazosin .  She denies any drowsiness.  She denies SI.  She continues to take lorazepam  every day for anxiety.  She agrees with the plans as outlined below.   Substance use   Tobacco Alcohol Other substances/  Current   denies denies  Past   denies denies  Past Treatment            Household: [name]/husband, son Marital status:married Number of children: 77 yo son, mildly autistic. 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 Bipolar affective disorder, currently depressed, moderate (HCC)  F31.32     2. GAD (generalized anxiety disorder) [F41.1]  F41.1     3. Panic attack  F41.0       Past Psychiatric History: Please see initial evaluation for full details. I have reviewed the history. No updates at this time.     Past Medical History:  Past Medical History:  Diagnosis  Date   ADHD (attention deficit hyperactivity disorder)    Anxiety    Bipolar disorder (HCC)    Depression     Past Surgical History:  Procedure Laterality Date   ABDOMINAL HYSTERECTOMY     HERNIA REPAIR     KIDNEY SURGERY Right    KNEE ARTHROSCOPY Bilateral    WRIST SURGERY Left     Family Psychiatric History: Please see initial evaluation for full details. I have reviewed the history. No updates at this time.     Family History:  Family History  Problem Relation Age of Onset   Anxiety disorder Mother    Depression Mother    Schizophrenia Maternal Grandmother     Social History:  Social History   Socioeconomic History   Marital status: Married    Spouse name: greg   Number of children: 1    Years of education: Not on file   Highest education level: Some college, no degree  Occupational History   Not on file  Tobacco Use   Smoking status: Never   Smokeless tobacco: Never  Vaping Use   Vaping status: Never Used  Substance and Sexual Activity   Alcohol use: Not Currently   Drug use: Never   Sexual activity: Yes  Other Topics Concern   Not on file  Social History Narrative   Not on file   Social Drivers of Health   Financial Resource Strain: Medium Risk (10/15/2020)   Received from [facility], [facility]   Overall Financial Resource Strain (CARDIA)    Difficulty of Paying Living Expenses: Somewhat hard  Food Insecurity: No Food Insecurity (10/15/2020)   Received from [facility], [facility]   Hunger Vital Sign    Worried About Running Out of Food in the Last Year: Never true    Ran Out of Food in the Last Year: Never true  Transportation Needs: No Transportation Needs (10/15/2020)   Received from [facility], [facility]   PRAPARE - Transportation    Lack of Transportation (Medical): No    Lack of Transportation (Non-Medical): No  Physical Activity: Sufficiently Active (11/28/2018)   Exercise Vital Sign    Days of Exercise per Week: 6 days    Minutes of Exercise per Session: 60 min  Stress: Stress Concern Present (11/28/2018)   [employer] of Occupational Health - Occupational Stress Questionnaire    Feeling of Stress : Rather much  Social Connections: Unknown (01/12/2019)   Received from [facility], [facility]   Social Connection and Isolation Panel [NHANES]    Frequency of Communication with Friends and Family: Not on file    Frequency of Social Gatherings with Friends and Family: Not on file    Attends Religious Services: Not on file    Active Member of Clubs or Organizations: Not on file    Attends Banker Meetings: Not on file    Marital Status: Married    Allergies:  Allergies  Allergen Reactions    Penicillins Hives, Itching and Swelling    And itching    Ciprofloxacin Itching   Morphine Sulfate Itching   Sulfa Antibiotics Itching and Nausea And Vomiting   Tape Itching and Other (See Comments)    Welts & skin peeling off Welts & skin peeling off    Metabolic Disorder Labs: No results found for: "HGBA1C", "MPG" No results found for: "PROLACTIN" No results found for: "CHOL", "TRIG", "HDL", "CHOLHDL", "VLDL", "LDLCALC" Lab Results  Component Value Date   TSH 2.271 12/30/2022    Therapeutic Level Labs: No results found for: "LITHIUM" No results found for: "VALPROATE" No results found for: "CBMZ"  Current Medications: Current Outpatient Medications  Medication Sig Dispense Refill   prazosin  (MINIPRESS ) 2 MG capsule Take 2 capsules (4 mg total) by mouth at bedtime. 60 capsule 0   amitriptyline  (ELAVIL ) 150 MG tablet Take 1 tablet (150 mg total) by mouth at bedtime. 30 tablet 3   amitriptyline  (ELAVIL ) 150 MG tablet Take 1 tablet (150 mg total) by mouth at bedtime. 30 tablet 1   gabapentin  (NEURONTIN ) 100 MG capsule Take 300 mg by mouth 3 (three) times daily.     lamoTRIgine  (LAMICTAL ) 200 MG tablet Take 1 tablet (200 mg total) by mouth 2 (two) times daily. 60 tablet 5   lamoTRIgine  (LAMICTAL ) 200 MG tablet Take 1 tablet (200 mg total) by mouth 2 (two) times daily. 60 tablet 1   [START ON 05/08/2024] LORazepam  (ATIVAN ) 2 MG tablet Take 1 tablet (2 mg total) by mouth daily as needed for anxiety. 30 tablet 0   prazosin  (MINIPRESS ) 1 MG capsule Take 1 capsule (1 mg total) by mouth at bedtime for 3 days. 3 capsule 0   No current facility-administered medications for this visit.     Musculoskeletal: Strength & Muscle Tone: N/A Gait & Station: N/A Patient leans: N/A  Psychiatric Specialty Exam: Review of Systems  Psychiatric/Behavioral:  Positive for dysphoric mood. Negative for agitation, behavioral problems, confusion, decreased concentration, hallucinations, self-injury,  sleep disturbance and suicidal ideas. The patient is nervous/anxious. The patient is not hyperactive.   All other systems reviewed and are negative.   There were no vitals taken for this visit.There is no height or weight on file to calculate BMI.  General Appearance: Well Groomed  Eye Contact:  Good  Speech:  Clear and Coherent  Volume:  Normal  Mood:  same  Affect:  Appropriate, Congruent, and Tearful  Thought Process:  Coherent  Orientation:  Full (Time, Place, and Person)  Thought Content: Logical   Suicidal Thoughts:  No  Homicidal Thoughts:  No  Memory:  Immediate;   Good  Judgement:  Good  Insight:  Good  Psychomotor Activity:  Normal  Concentration:  Concentration: Fair and Attention Span: Fair  Recall:  Poor- occasional lapses in [employer] of Knowledge: Good  Language: Good  Akathisia:  No  Handed:  Right  AIMS (if indicated): not done  Assets:  Communication Skills Desire for Improvement  ADL's:  Intact  Cognition: WNL  Sleep:  Poor   Screenings: GAD-7    Flowsheet Row Office Visit from 12/30/2022 in [facility] Visit from 11/02/2022 in [facility] from 09/09/2022 in [facility] Visit from 07/15/2022 in [facility] Visit from 06/14/2022 in [facility]  Total GAD-7 Score 12 11 12 11 21       PHQ2-9    Flowsheet Row Office Visit from 12/30/2022 in [facility] Office Visit from 11/02/2022 in [facility] Office Visit from 09/09/2022 in [facility] Office Visit from 07/15/2022 in [facility] Office Visit from 06/14/2022 in [facility]   PHQ-2 Total Score 2 2 2 2 4   PHQ-9 Total Score 6 9 10 7  14  Assessment and Plan:  Amillya Chavira is a 53 y.o. year old female with a history of bipolar disorder, ADHD, insomnia, s/p gastric surgery, CKD stage 3 s/p nephrectomy, who presents for follow up appointment for below.   1. Bipolar affective disorder, currently depressed, moderate (HCC) 2. GAD (generalized anxiety disorder) [F41.1] 3. Panic attack Acute stressors include: loss of her father, concern about left flank mass, CKD, hernia surgery  Other stressors include: loss of her mother, her son with autism    History:  originally on lamotrigine  200 mg BID, amitriptyline  75 mg at night, Buspar  30 mg BID, lorazepam  1 mg dailyprn   She continues to experience worsening in depressive symptoms, anxiety.  She also reports vivid images of her father, who was in hospice care, with a sense of being traumatized.  Will uptitrate prazosin  to optimize treatment for hyperarousal symptoms.  Discussed potential risk of orthostatic hypotension.  Will continue lamotrigine  for bipolar disorder.  Will continue amitriptyline  for depression and migraine.  Will continue lorazepam  as needed for anxiety.  Noted that she is on gabapentin , which was up titrated by her pain specialist.  She will continue to see a therapist at palliative care for group therapy.    4. Iron deficiency She was advised again to obtain labs for monitoring.    # high risk medication use  She was advised again to obtain UDS.    Plan - [facility] Continue lamotrigine  200 mg twice a day  Continue amitriptyline  150 mg at night  Increase prazosin  4 mg at night  Continue lorazepam  2 mg daily as needed for extreme anxiety   Obtain lab, iron panels, UDS at [facility] Next appointment: 7/9 at 4:30, video - on gabapentin  300 mg tid  She sees PCP through [facility]  She hopes to obtain disability for leave of absence when she experiences flare-up of her symptoms. I would support  this, as her mood symptoms currently interfere with her ability to work at full capacity.   Past trials of medication: sertraline , lexapro, Buspar  (limited benefit), bupropion  (limited benefit), lithium, Depakote, lamotrigine , olanzapine, Abilify, Geodon, quetiapine (weight gain),  risperidone (nausea), Latuda  (weight gain) (unable to afford Vraylar )   The patient demonstrates the following risk factors for suicide: Chronic risk factors for suicide include: The. Acute risk factors for suicide include: loss (financial, interpersonal, professional). Protective factors for this patient include: positive social support and hope for the future. Considering these factors, the overall suicide risk at this point appears to be low. Patient is appropriate for outpatient follow up.   Collaboration of Care: Collaboration of Care: Other reviewed notes in Epic  Patient/Guardian was advised Release of Information must be obtained prior to any record release in order to collaborate their care with an outside provider. Patient/Guardian was advised if they have not already done so to contact the registration department to sign all necessary forms in order for us  to release information regarding their care.   Consent: Patient/Guardian gives verbal consent for treatment and assignment of benefits for services provided during this visit. Patient/Guardian expressed understanding and agreed to proceed.    Todd Fossa, MD 04/16/2024, 5:31 PM

## 2024-04-19 ENCOUNTER — Telehealth: Payer: Self-pay | Admitting: Psychiatry

## 2024-04-19 NOTE — Telephone Encounter (Signed)
 Reviewed labs completed in May 2025, Amy Leblanc [facility] CBC- wnl, including Hb 12.7 BMP wnl except Creat 1.14, eGFR 57 Iron panels, UDS not available

## 2024-05-04 ENCOUNTER — Telehealth: Payer: Self-pay | Admitting: Psychiatry

## 2024-05-09 ENCOUNTER — Telehealth: Payer: Self-pay | Admitting: Psychiatry

## 2024-05-09 ENCOUNTER — Encounter: Payer: Self-pay | Admitting: Psychiatry

## 2024-05-09 ENCOUNTER — Other Ambulatory Visit
Admission: RE | Admit: 2024-05-09 | Discharge: 2024-05-09 | Disposition: A | Discharge status: Home / Self Care | Payer: Self-pay | Source: Ambulatory Visit | Attending: Psychiatry | Admitting: Psychiatry

## 2024-05-09 DIAGNOSIS — E611 Iron deficiency: Secondary | ICD-10-CM | POA: Insufficient documentation

## 2024-05-09 DIAGNOSIS — F410 Panic disorder [episodic paroxysmal anxiety] without agoraphobia: Secondary | ICD-10-CM

## 2024-05-09 DIAGNOSIS — Z130 Encounter for screening for diseases of the blood and blood-forming organs and certain disorders involving the immune mechanism: Secondary | ICD-10-CM | POA: Insufficient documentation

## 2024-05-09 DIAGNOSIS — F411 Generalized anxiety disorder: Secondary | ICD-10-CM

## 2024-05-09 DIAGNOSIS — Z79899 Other long term (current) drug therapy: Secondary | ICD-10-CM

## 2024-05-09 DIAGNOSIS — F3132 Bipolar disorder, current episode depressed, moderate: Secondary | ICD-10-CM

## 2024-05-09 LAB — FERRITIN: Ferritin: 25 ng/mL (ref 11–307)

## 2024-05-09 LAB — IRON AND TIBC
Iron: 77 ug/dL (ref 28–170)
Saturation Ratios: 24 % (ref 10.4–31.8)
TIBC: 319 ug/dL (ref 250–450)
UIBC: 242 ug/dL

## 2024-05-09 NOTE — Telephone Encounter (Signed)
 Could you ask her if she's available for a visit during the current open slot? If not, please add her to the waiting list.

## 2024-05-09 NOTE — Patient Instructions (Signed)
 Continue lamotrigine  200 mg twice a day  Continue amitriptyline  150 mg at night  Please notify us  of your home blood pressure- if no issues, will plan to increase prazosin  6 mg at night  Continue lorazepam  2 mg daily as needed for extreme anxiety   Start ferrous sulfate 325 mg daily Next appointment: 7/9 at 4:30, video

## 2024-05-09 NOTE — Progress Notes (Signed)
 Virtual Visit via Video Note  I connected with Amy Leblanc on 05/09/24 at  2:00 PM EDT by a video enabled telemedicine application and verified that I am speaking with the correct person using two identifiers.  Location: Patient: home Provider: home office Persons participated in the visit- patient, provider    I discussed the limitations of evaluation and management by telemedicine and the availability of in person appointments. The patient expressed understanding and agreed to proceed.   I discussed the assessment and treatment plan with the patient. The patient was provided an opportunity to ask questions and all were answered. The patient agreed with the plan and demonstrated an understanding of the instructions.   The patient was advised to call back or seek an in-person evaluation if the symptoms worsen or if the condition fails to improve as anticipated.   Katheren Sleet, MD    [facility] MD/PA/NP OP Progress Note  05/09/2024 2:39 PM Amy Leblanc  MRN:  [id number]  Chief Complaint:  Chief Complaint  Patient presents with   Follow-up   HPI:  This is a follow-up appointment for bipolar disorder, anxiety and insomnia.  This appointment was made urgently due to concern of her symptoms.  She states that she is at her parents house.  She is trying to clean up so that other people can moving.  However, she cannot take away the pictures.  She tearfully states that she just cannot.  She wants her parents so bad.  She states that her husband does not realize why she feels this way.  He told her that she is having mental breakdown.  It hurt her feelings.  She agrees to try focusing on herself.  She states that she will be renting the house to somebody who she knows for many years.  She feels relieved with this.  Although she had felt a little better since uptitration of prazosin , she continues to experience episodes.  She also reports having images of her father, although she denies any  hallucinations.  She has insomnia.  She denies SI, HI, hallucinations. She sees a therapist, and it has been helpful. Although she politely requests the medication to be prescribed for intense anxiety instead of lorazepam  as needed and has limited benefit, she agrees with the plans as outlined below.    Visit Diagnosis:    ICD-10-CM   1. Bipolar affective disorder, currently depressed, moderate (HCC)  F31.32     2. GAD (generalized anxiety disorder) [F41.1]  F41.1     3. Panic attack  F41.0     4. Iron deficiency  E61.1     5. High risk medication use  Z79.899       Past Psychiatric History: Please see initial evaluation for full details. I have reviewed the history. No updates at this time.     Past Medical History:  Past Medical History:  Diagnosis Date   ADHD (attention deficit hyperactivity disorder)    Anxiety    Bipolar disorder (HCC)    Depression     Past Surgical History:  Procedure Laterality Date   ABDOMINAL HYSTERECTOMY     HERNIA REPAIR     KIDNEY SURGERY Right    KNEE ARTHROSCOPY Bilateral    WRIST SURGERY Left     Family Psychiatric History: Please see initial evaluation for full details. I have reviewed the history. No updates at this time.     Family History:  Family History  Problem Relation Age of Onset   Anxiety disorder  Mother    Depression Mother    Schizophrenia Maternal Grandmother     Social History:  Social History   Socioeconomic History   Marital status: Married    Spouse name: greg   Number of children: 1   Years of education: Not on file   Highest education level: Some college, no degree  Occupational History   Not on file  Tobacco Use   Smoking status: Never   Smokeless tobacco: Never  Vaping Use   Vaping status: Never Used  Substance and Sexual Activity   Alcohol use: Not Currently   Drug use: Never   Sexual activity: Yes  Other Topics Concern   Not on file  Social History Narrative   Not on file   Social Drivers  of Health   Financial Resource Strain: Medium Risk (10/15/2020)   Received from [facility]   Overall Financial Resource Strain (CARDIA)    Difficulty of Paying Living Expenses: Somewhat hard  Food Insecurity: No Food Insecurity (10/15/2020)   Received from [facility]   Hunger Vital Sign    Within the past 12 months, you worried that your food would run out before you got the money to buy more.: Never true    Within the past 12 months, the food you bought just didn't last and you didn't have money to get more.: Never true  Transportation Needs: No Transportation Needs (10/15/2020)   Received from [facility]   PRAPARE - Transportation    Lack of Transportation (Medical): No    Lack of Transportation (Non-Medical): No  Physical Activity: Sufficiently Active (11/28/2018)   Exercise Vital Sign    Days of Exercise per Week: 6 days    Minutes of Exercise per Session: 60 min  Stress: Stress Concern Present (11/28/2018)   [employer] of Occupational Health - Occupational Stress Questionnaire    Feeling of Stress : Rather much  Social Connections: Unknown (01/12/2019)   Received from [facility]   Social Connection and Isolation Panel    Frequency of Communication with Friends and Family: Not on file    Frequency of Social Gatherings with Friends and Family: Not on file    Attends Religious Services: Not on file    Active Member of Clubs or Organizations: Not on file    Attends Banker Meetings: Not on file    Marital Status: Married    Allergies:  Allergies  Allergen Reactions   Penicillins Hives, Itching and Swelling    And itching    Ciprofloxacin Itching   Morphine Sulfate Itching   Sulfa Antibiotics Itching and Nausea And Vomiting   Tape Itching and Other (See Comments)    Welts & skin peeling off Welts & skin peeling off    Metabolic Disorder Labs: No results found for: HGBA1C, MPG No results found for: PROLACTIN No results found  for: CHOL, TRIG, HDL, CHOLHDL, VLDL, LDLCALC Lab Results  Component Value Date   TSH 4.14 04/11/2024   TSH 2.271 12/30/2022    Therapeutic Level Labs: No results found for: LITHIUM No results found for: VALPROATE No results found for: CBMZ  Current Medications: Current Outpatient Medications  Medication Sig Dispense Refill   amitriptyline  (ELAVIL ) 150 MG tablet Take 1 tablet (150 mg total) by mouth at bedtime. 30 tablet 3   amitriptyline  (ELAVIL ) 150 MG tablet Take 1 tablet (150 mg total) by mouth at bedtime. 30 tablet 1   gabapentin  (NEURONTIN ) 100 MG capsule Take 300 mg  by mouth 3 (three) times daily.     lamoTRIgine  (LAMICTAL ) 200 MG tablet Take 1 tablet (200 mg total) by mouth 2 (two) times daily. 60 tablet 5   lamoTRIgine  (LAMICTAL ) 200 MG tablet Take 1 tablet (200 mg total) by mouth 2 (two) times daily. 60 tablet 1   LORazepam  (ATIVAN ) 2 MG tablet Take 1 tablet (2 mg total) by mouth daily as needed for anxiety. 30 tablet 0   prazosin  (MINIPRESS ) 1 MG capsule Take 1 capsule (1 mg total) by mouth at bedtime for 3 days. 3 capsule 0   prazosin  (MINIPRESS ) 2 MG capsule Take 2 capsules (4 mg total) by mouth at bedtime. 60 capsule 0   No current facility-administered medications for this visit.     Musculoskeletal: Strength & Muscle Tone: N/A Gait & Station: N/A Patient leans: N/A  Psychiatric Specialty Exam: Review of Systems  Psychiatric/Behavioral:  Positive for dysphoric mood and sleep disturbance. Negative for agitation, behavioral problems, confusion, decreased concentration, hallucinations, self-injury and suicidal ideas. The patient is nervous/anxious. The patient is not hyperactive.   All other systems reviewed and are negative.   There were no vitals taken for this visit.There is no height or weight on file to calculate BMI.  General Appearance: Well Groomed  Eye Contact:  Good  Speech:  Clear and Coherent  Volume:  Normal  Mood:  very lonely   Affect:  Appropriate, Congruent, and Tearful  Thought Process:  Coherent  Orientation:  Full (Time, Place, and Person)  Thought Content: Logical   Suicidal Thoughts:  No  Homicidal Thoughts:  No  Memory:  Immediate;   Good  Judgement:  Good  Insight:  Good  Psychomotor Activity:  Normal  Concentration:  Concentration: Good and Attention Span: Good  Recall:  Good  Fund of Knowledge: Good  Language: Good  Akathisia:  No  Handed:  Right  AIMS (if indicated): not done  Assets:  Communication Skills Desire for Improvement  ADL's:  Intact  Cognition: WNL  Sleep:  Poor   Screenings: GAD-7    Flowsheet Row Office Visit from 12/30/2022 in [facility] Office Visit from 11/02/2022 in [facility] Office Visit from 09/09/2022 in [facility] Office Visit from 07/15/2022 in [facility] Office Visit from 06/14/2022 in [facility]  Total GAD-7 Score 12 11 12 11 21    PHQ2-9    Flowsheet Row Office Visit from 12/30/2022 in [facility] Office Visit from 11/02/2022 in [facility] Office Visit from 09/09/2022 in [facility] Office Visit from 07/15/2022 in [facility] Office Visit from 06/14/2022 in [facility]  PHQ-2 Total Score 2 2 2 2 4   PHQ-9 Total Score 6 9 10 7 14      Assessment and Plan:  Amy Leblanc is a 53 y.o. year old female with a history of bipolar disorder, ADHD, insomnia, s/p gastric surgery, CKD stage 3 s/p nephrectomy, who presents for follow up appointment for below.   1. Bipolar affective disorder, currently depressed, moderate (HCC) 2. GAD (generalized anxiety disorder)  [F41.1] 3. Panic attack Acute stressors include: loss of her father, concern about left flank mass, CKD, hernia surgery  Other stressors include: loss of her mother, her son with autism    History:  originally on lamotrigine  200 mg BID,  amitriptyline  75 mg at night, Buspar  30 mg BID, lorazepam  1 mg dailyprn   Exam is notable for tearful affect, and she continues to experience depressive symptoms and anxiety since the last visit along with vivid images of her father, who was in hospice care, with a sense of being traumatized.  She reports some benefit from prazosin .  She agrees to try uptitration after obtaining home blood pressure.  Discussed potential risk of orthostatic hypotension, dizziness.  Will continue lamotrigine  for bipolar disorder.  Will continue amitriptyline  for depression and migraine.  Will continue lorazepam  as needed for anxiety.  She will continue to see her therapist at palliative care.   4. Iron deficiency - ferritin 25 04/2024 She has iron deficiency without anemia.  Given she has fatigue, she was advised to take over-the-counter iron tablet.   5. High risk medication use Pending UDS     Plan - [facility] Continue lamotrigine  200 mg twice a day  Continue amitriptyline  150 mg at night  She will send HBP- if no issues, will plan to increase prazosin  6 mg at night  Continue lorazepam  2 mg daily as needed for extreme anxiety   Start ferrous sulfate 325 mg daily Next appointment: 7/9 at 4:30, video - on gabapentin  300 mg tid  She sees PCP through [facility]   She hopes to obtain disability for leave of absence when she experiences flare-up of her symptoms. I would support this, as her mood symptoms currently interfere with her ability to work at full capacity.   Past trials of medication: sertraline , lexapro, Buspar  (limited benefit), bupropion  (limited benefit), lithium, Depakote, lamotrigine , olanzapine, Abilify, Geodon, quetiapine (weight gain),  risperidone (nausea),  Latuda  (weight gain) (unable to afford Vraylar )   The patient demonstrates the following risk factors for suicide: Chronic risk factors for suicide include: The. Acute risk factors for suicide include: loss (financial, interpersonal, professional). Protective factors for this patient include: positive social support and hope for the future. Considering these factors, the overall suicide risk at this point appears to be low. Patient is appropriate for outpatient follow up.   Collaboration of Care: Collaboration of Care: Other reviewed notes in Epic  Patient/Guardian was advised Release of Information must be obtained prior to any record release in order to collaborate their care with an outside provider. Patient/Guardian was advised if they have not already done so to contact the registration department to sign all necessary forms in order for us  to release information regarding their care.   Consent: Patient/Guardian gives verbal consent for treatment and assignment of benefits for services provided during this visit. Patient/Guardian expressed understanding and agreed to proceed.    Katheren Sleet, MD 05/09/2024, 2:39 PM

## 2024-05-10 ENCOUNTER — Ambulatory Visit: Payer: Self-pay | Admitting: Psychiatry

## 2024-05-10 LAB — URINE DRUGS OF ABUSE SCREEN W ALC, ROUTINE (REF LAB)
Amphetamines, Urine: NEGATIVE ng/mL
Barbiturate, Ur: NEGATIVE ng/mL
Benzodiazepine Quant, Ur: NEGATIVE ng/mL
Cannabinoid Quant, Ur: NEGATIVE ng/mL
Cocaine (Metab.): NEGATIVE ng/mL
Creatinine, Urine: 152.3 mg/dL (ref 20.0–300.0)
Ethanol U, Quan: NEGATIVE %
Methadone Screen, Urine: NEGATIVE ng/mL
Nitrite Urine, Quantitative: NEGATIVE ug/mL
OPIATE SCREEN URINE: NEGATIVE ng/mL
Phencyclidine, Ur: NEGATIVE ng/mL
Propoxyphene, Urine: NEGATIVE ng/mL
pH, Urine: 5.7 (ref 4.5–8.9)

## 2024-05-15 ENCOUNTER — Other Ambulatory Visit: Payer: Self-pay | Admitting: Psychiatry

## 2024-05-20 NOTE — Progress Notes (Unsigned)
 Virtual Visit via Video Note  I connected with Amy Leblanc on 05/24/24 at  4:30 PM EDT by a video enabled telemedicine application and verified that I am speaking with the correct person using two identifiers.  Location: Patient: home Provider: home office Persons participated in the visit- patient, provider    I discussed the limitations of evaluation and management by telemedicine and the availability of in person appointments. The patient expressed understanding and agreed to proceed.    I discussed the assessment and treatment plan with the patient. The patient was provided an opportunity to ask questions and all were answered. The patient agreed with the plan and demonstrated an understanding of the instructions.   The patient was advised to call back or seek an in-person evaluation if the symptoms worsen or if the condition fails to improve as anticipated.   Amy Sleet, MD    [facility] MD/PA/NP OP Progress Note  05/24/2024 8:29 AM Amy Leblanc  MRN:  [id number]  Chief Complaint:  Chief Complaint  Patient presents with   Follow-up   HPI:  This is a follow-up appointment for bipolar disorder, anxiety, insomnia.  She states that she has been doing the same.  She has not been able to go to the second floor at work as she cannot handle it.  She will just go to work, although she struggles with her mood symptoms.  She has fluctuation in her sleep.  She is trying to do the best she could when she is asked about the house she is trying to rent.  She cried out.  She has been taking out pictures as she knows she needs to do it, although she sometimes need to stop.  She feels that she is facing with the reality that they are gone.  She feels guilty that she misses her father the most.  She had a visit with a therapist a few days ago, and she fell apart.  She reports the relationship with her husband and her son, although she may want to isolate at times.  She tends to eat unhealthy food.   She denies SI, HI, hallucinations.  She denies decreased need for sleep.  Although she did have an episode of increase in energy, it lasts only for a day.  She was cleaning a closet at that time.  She denies euphoria or increased goal-directed activities.  She agrees with the plans as outlined below.    HR 120- 80,  HBP 121/80  Visit Diagnosis:    ICD-10-CM   1. Mood disorder (HCC)  F39     2. GAD (generalized anxiety disorder) [F41.1]  F41.1     3. Panic attack  F41.0       Past Psychiatric History: Please see initial evaluation for full details. I have reviewed the history. No updates at this time.     Past Medical History:  Past Medical History:  Diagnosis Date   ADHD (attention deficit hyperactivity disorder)    Anxiety    Bipolar disorder (HCC)    Depression     Past Surgical History:  Procedure Laterality Date   ABDOMINAL HYSTERECTOMY     HERNIA REPAIR     KIDNEY SURGERY Right    KNEE ARTHROSCOPY Bilateral    WRIST SURGERY Left     Family Psychiatric History: Please see initial evaluation for full details. I have reviewed the history. No updates at this time.     Family History:  Family History  Problem Relation Age of  Onset   Anxiety disorder Mother    Depression Mother    Schizophrenia Maternal Grandmother     Social History:  Social History   Socioeconomic History   Marital status: Married    Spouse name: Amy Leblanc   Number of children: 1   Years of education: Not on file   Highest education level: Some college, no degree  Occupational History   Not on file  Tobacco Use   Smoking status: Never   Smokeless tobacco: Never  Vaping Use   Vaping status: Never Used  Substance and Sexual Activity   Alcohol use: Not Currently   Drug use: Never   Sexual activity: Yes  Other Topics Concern   Not on file  Social History Narrative   Not on file   Social Drivers of Health   Financial Resource Strain: Medium Risk (10/15/2020)   Received from [facility]   Overall Financial Resource Strain (CARDIA)    Difficulty of Paying Living Expenses: Somewhat hard  Food Insecurity: No Food Insecurity (10/15/2020)   Received [facility]   Hunger Vital Sign    Within the past 12 months, you worried that your food would run out before you got the money to buy more.: Never true    Within the past 12 months, the food you bought just didn't last and you didn't have money to get more.: Never true  Transportation Needs: No Transportation Needs (10/15/2020)   Received from [facility]   PRAPARE - Transportation    Lack of Transportation (Medical): No    Lack of Transportation (Non-Medical): No  Physical Activity: Sufficiently Active (11/28/2018)   Exercise Vital Sign    Days of Exercise per Week: 6 days    Minutes of Exercise per Session: 60 min  Stress: Stress Concern Present (11/28/2018)   [employer] of Occupational Health - Occupational Stress Questionnaire    Feeling of Stress : Rather much  Social Connections: Unknown (01/12/2019)   Received from [facility]   Social Connection and Isolation Panel    Frequency of Communication with Friends and Family: Not on file    Frequency of Social Gatherings with Friends and Family: Not on file    Attends Religious Services: Not on file    Active Member of Clubs or Organizations: Not on file    Attends Banker Meetings: Not on file    Marital Status: Married    Allergies:  Allergies  Allergen Reactions   Penicillins Hives, Itching and Swelling    And itching    Ciprofloxacin Itching   Morphine Sulfate Itching   Sulfa Antibiotics Itching and Nausea And Vomiting   Tape Itching and Other (See Comments)    Welts & skin peeling off Welts & skin peeling off    Metabolic Disorder Labs: No results found for: HGBA1C, MPG No results found for: PROLACTIN No results found for: CHOL, TRIG, HDL, CHOLHDL, VLDL, LDLCALC Lab Results  Component Value Date    TSH 4.14 04/11/2024   TSH 2.271 12/30/2022    Therapeutic Level Labs: No results found for: LITHIUM No results found for: VALPROATE No results found for: CBMZ  Current Medications: Current Outpatient Medications  Medication Sig Dispense Refill   amitriptyline  (ELAVIL ) 150 MG tablet Take 1 tablet (150 mg total) by mouth at bedtime. 30 tablet 3   amitriptyline  (ELAVIL ) 150 MG tablet Take 1 tablet (150 mg total) by mouth at bedtime. 30 tablet 5   gabapentin  (NEURONTIN ) 100  MG capsule Take 300 mg by mouth 3 (three) times daily.     lamoTRIgine  (LAMICTAL ) 200 MG tablet Take 1 tablet (200 mg total) by mouth 2 (two) times daily. 60 tablet 5   lamoTRIgine  (LAMICTAL ) 200 MG tablet Take 1 tablet (200 mg total) by mouth 2 (two) times daily. 60 tablet 1   [START ON 06/09/2024] LORazepam  (ATIVAN ) 2 MG tablet Take 1 tablet (2 mg total) by mouth daily as needed for anxiety. 30 tablet 0   prazosin  (MINIPRESS ) 1 MG capsule Take 1 capsule (1 mg total) by mouth at bedtime for 3 days. 3 capsule 0   [START ON 06/14/2024] prazosin  (MINIPRESS ) 2 MG capsule Take 2 capsules (4 mg total) by mouth at bedtime. 60 capsule 0   No current facility-administered medications for this visit.     Musculoskeletal: Strength & Muscle Tone: N/A Gait & Station: N/A Patient leans: N/A  Psychiatric Specialty Exam: Review of Systems  Psychiatric/Behavioral:  Positive for decreased concentration, dysphoric mood and sleep disturbance. Negative for agitation, behavioral problems, confusion, hallucinations, self-injury and suicidal ideas. The patient is nervous/anxious. The patient is not hyperactive.   All other systems reviewed and are negative.   There were no vitals taken for this visit.There is no height or weight on file to calculate BMI.  General Appearance: Well Groomed  Eye Contact:  Good  Speech:  Clear and Coherent  Volume:  Normal  Mood:  Depressed  Affect:  Appropriate, Congruent, and Tearful  Thought  Process:  Coherent  Orientation:  Full (Time, Place, and Person)  Thought Content: Logical   Suicidal Thoughts:  No  Homicidal Thoughts:  No  Memory:  Immediate;   Good  Judgement:  Good  Insight:  Good  Psychomotor Activity:  Normal  Concentration:  Concentration: Good and Attention Span: Good  Recall:  Good  Fund of Knowledge: Good  Language: Good  Akathisia:  No  Handed:  Right  AIMS (if indicated): not done  Assets:  Communication Skills Desire for Improvement  ADL's:  Intact  Cognition: WNL  Sleep:  Poor   Screenings: GAD-7    Flowsheet Row Office Visit from 12/30/2022 in [facility] Office Visit from 11/02/2022 in [facility] Office Visit from 09/09/2022 in [facility] Office Visit from 07/15/2022 in [facility] Office Visit from 06/14/2022 in [facility]  Total GAD-7 Score 12 11 12 11 21    PHQ2-9    Flowsheet Row Office Visit from 12/30/2022 in [facility] Office Visit from 11/02/2022 in [facility] Office Visit from 09/09/2022 in [facility] Office Visit from 07/15/2022 in [facility] Office Visit from 06/14/2022 in [facility]  PHQ-2 Total Score 2 2 2 2 4   PHQ-9 Total Score 6 9 10 7 14      Assessment and Plan:  Amberly Livas is a 53 y.o. year old female with a history of depression, ADHD, insomnia, s/p gastric surgery, CKD stage 3 s/p nephrectomy, who presents for follow up appointment for below.   1. Mood disorder (HCC) 2. GAD (generalized anxiety disorder) [F41.1] 3. Panic attack R.o MDD with mixed episode R/o bipolar II disorder Acute stressors include: loss of her  father, concern about left flank mass, CKD, hernia surgery  Other stressors include: loss of her mother, her  son with autism    History:  originally on lamotrigine  200 mg BID, amitriptyline  75 mg at night, Buspar  30 mg BID, lorazepam  1 mg dailyprn    She continues to experience depressive symptoms, anxiety with occasional panic attacks, along with vivid images of her father, who was in a hospice care, with a sense of being traumatized.  Although the hope was that uptitrate prazosin , we will hold this at this time due to reported tachycardia.  Given she has depressive symptoms reflect made to medication management, will make referral for ketamine treatment.  Noted that although she carried diagnosis of bipolar disorder when she was transferred care to this provider, she reports only subthreshold hypomanic symptoms of impulsive shopping, decreased need for sleep years ago.  Since this writer has taken over the care, she has not had any episodes consistent with hypomania/mania, while lamotrigine  has been continued from the initial visit.  Will continue current dose of lamotrigine  for mood dysregulation.  Will continue amitriptyline  for depression, migraine.  Discussed potential risk of tachycardia.  Will continue prazosin  for PTSD related symptoms , and clonazepam as needed for anxiety.   4. Iron deficiency - ferritin 25 04/2024 She has iron deficiency without anemia.  She was advised again to take over-the-counter iron pill given she experiences fatigue.    5. High risk medication use UDS negative 04/2024  # tachycardia Newly addressed.  She has tachycardia, up to 120's.  According to the chart review, her last EKG was taken Nov 2024 with HR 114, QTc 443 mec.  She agrees to discuss this with her primary care provider for further evaluation.    Plan - [facility] Continue lamotrigine  200 mg twice a day  Continue amitriptyline  150 mg at night  Continue prazosin  4 mg at night  Continue lorazepam  2 mg daily  as needed for extreme anxiety   Start ferrous sulfate 325 mg daily Referral for [facility] Next appointment: 8/14 at 4:30, video - on gabapentin  300 mg tid  She sees PCP through [facility]   She hopes to obtain disability for leave of absence when she experiences flare-up of her symptoms. I would support this, as her mood symptoms currently interfere with her ability to work at full capacity.   Past trials of medication: sertraline , lexapro, Buspar  (limited benefit), bupropion  (limited benefit), lithium, Depakote, lamotrigine , olanzapine, Abilify, Geodon, quetiapine (weight gain),  risperidone (nausea), Latuda  (weight gain) (unable to afford Vraylar )   The patient demonstrates the following risk factors for suicide: Chronic risk factors for suicide include: The. Acute risk factors for suicide include: loss (financial, interpersonal, professional). Protective factors for this patient include: positive social support and hope for the future. Considering these factors, the overall suicide risk at this point appears to be low. Patient is appropriate for outpatient follow up.   Collaboration of Care: Collaboration of Care: Other reviewed notes in epic  Patient/Guardian was advised Release of Information must be obtained prior to any record release in order to collaborate their care with an outside provider. Patient/Guardian was advised if they have not already done so to contact the registration department to sign all necessary forms in order for us  to release information regarding their care.   Consent: Patient/Guardian gives verbal consent for treatment and assignment of benefits for services provided during this visit. Patient/Guardian expressed understanding and agreed to proceed.    Amy Sleet, MD 05/24/2024, 8:29 AM

## 2024-05-23 ENCOUNTER — Encounter: Payer: Self-pay | Admitting: Psychiatry

## 2024-05-23 ENCOUNTER — Telehealth (INDEPENDENT_AMBULATORY_CARE_PROVIDER_SITE_OTHER): Payer: Self-pay | Admitting: Psychiatry

## 2024-05-23 DIAGNOSIS — F410 Panic disorder [episodic paroxysmal anxiety] without agoraphobia: Secondary | ICD-10-CM

## 2024-05-23 DIAGNOSIS — F411 Generalized anxiety disorder: Secondary | ICD-10-CM

## 2024-05-23 DIAGNOSIS — F331 Major depressive disorder, recurrent, moderate: Secondary | ICD-10-CM

## 2024-05-23 DIAGNOSIS — F39 Unspecified mood [affective] disorder: Secondary | ICD-10-CM

## 2024-05-23 MED ORDER — LORAZEPAM 2 MG PO TABS: 2.0000 mg | ORAL_TABLET | Freq: Every day | ORAL | 0 refills | Status: DC | PRN

## 2024-05-23 MED ORDER — AMITRIPTYLINE HCL 150 MG PO TABS: 150.0000 mg | ORAL_TABLET | Freq: Every day | ORAL | 5 refills | Status: DC

## 2024-05-23 MED ORDER — PRAZOSIN HCL 2 MG PO CAPS: 4.0000 mg | ORAL_CAPSULE | Freq: Every day | ORAL | 0 refills | Status: DC

## 2024-05-23 NOTE — Patient Instructions (Signed)
 Continue lamotrigine  200 mg twice a day  Continue amitriptyline  150 mg at night  Continue prazosin  4 mg at night  Continue lorazepam  2 mg daily as needed for extreme anxiety   Start ferrous sulfate 325 mg daily Referral for Sp[facility] at [facility] Next appointment: 8/14 at 4:30

## 2024-06-07 DIAGNOSIS — F39 Unspecified mood [affective] disorder: Secondary | ICD-10-CM

## 2024-06-11 NOTE — Telephone Encounter (Signed)
 FMLA paperwork has been completed to allow excused absences for flare-ups of symptoms.  I've spent a total time of 12 minutes providing service to this patient-generated inquiry in the MyChart message

## 2024-06-14 NOTE — Telephone Encounter (Signed)
 The form has been updated per the patient's request to allow leave through August.  I've spent a total time of 25  minutes providing service to this patient-generated inquiry in the MyChart message

## 2024-06-15 NOTE — Telephone Encounter (Signed)
 I am not in the office on Fridays. The form will be faxed on Monday

## 2024-06-16 ENCOUNTER — Other Ambulatory Visit: Payer: Self-pay | Admitting: Psychiatry

## 2024-06-18 ENCOUNTER — Telehealth: Payer: Self-pay

## 2024-06-18 NOTE — Telephone Encounter (Signed)
 Medication management - Faxed patient's completed FML2 forms back to her [employer] absence Management that Dr. Vickey completed and requested be faxed. Copy sent to scan.

## 2024-06-23 NOTE — Progress Notes (Signed)
 Virtual Visit via Video Note  I connected with Amy Leblanc on 06/28/24 at  4:30 PM EDT by a video enabled telemedicine application and verified that I am speaking with the correct person using two identifiers.  Location: Patient: home Provider: office Persons participated in the visit- patient, provider    I discussed the limitations of evaluation and management by telemedicine and the availability of in person appointments. The patient expressed understanding and agreed to proceed.     I discussed the assessment and treatment plan with the patient. The patient was provided an opportunity to ask questions and all were answered. The patient agreed with the plan and demonstrated an understanding of the instructions.   The patient was advised to call back or seek an in-person evaluation if the symptoms worsen or if the condition fails to improve as anticipated.   Katheren Sleet, MD    [facility] MD/PA/NP OP Progress Note  06/28/2024 5:21 PM Amy Leblanc  MRN:  [id number]  Chief Complaint:  Chief Complaint  Patient presents with   Follow-up   HPI:  This is a follow-up appointment for mood disorder, anxiety.  She states that she is not doing well.  Her boss is threatening.  she has been working few hours, and goes back home .  The work has been more stressful.  She feels like she has been threatened to leave.  This has been additional stress. She does nothing at home.  She feels like she is taking space.  She feels like she is not herself.  She tends to take out on others, although she does not want to.  Although she does not want to be by herself, she does not want to be with others either.  Although she has been trying to get things ready to rent the place, it has been difficult.  Although her husband has been helping, he does not want him to get rid of things.  When she sees a shoe kit, it reminds her of her father and associated memories.  She cries, and she states that this is how she has  been doing.  Although she has been trying her best, she feels tired of feeling tired.  She was able to go to the [employer] and it was fine.  She also enjoyed going to [employer].  She agrees to do this regularly.  She has fair sleep.  She denies SI, HI, hallucinations.  She feels anxious, and takes lorazepam  every day for this.   Visit Diagnosis:    ICD-10-CM   1. Mood disorder (HCC) [F39]  F39     2. GAD (generalized anxiety disorder) [F41.1]  F41.1     3. Panic attack  F41.0       Past Psychiatric History: Please see initial evaluation for full details. I have reviewed the history. No updates at this time.     Past Medical History:  Past Medical History:  Diagnosis Date   ADHD (attention deficit hyperactivity disorder)    Anxiety    Bipolar disorder (HCC)    Depression     Past Surgical History:  Procedure Laterality Date   ABDOMINAL HYSTERECTOMY     HERNIA REPAIR     KIDNEY SURGERY Right    KNEE ARTHROSCOPY Bilateral    WRIST SURGERY Left     Family Psychiatric History: Please see initial evaluation for full details. I have reviewed the history. No updates at this time.     Family History:  Family History  Problem  Relation Age of Onset   Anxiety disorder Mother    Depression Mother    Schizophrenia Maternal Grandmother     Social History:  Social History   Socioeconomic History   Marital status: Married    Spouse name: greg   Number of children: 1   Years of education: Not on file   Highest education level: Some college, no degree  Occupational History   Not on file  Tobacco Use   Smoking status: Never   Smokeless tobacco: Never  Vaping Use   Vaping status: Never Used  Substance and Sexual Activity   Alcohol use: Not Currently   Drug use: Never   Sexual activity: Yes  Other Topics Concern   Not on file  Social History Narrative   Not on file   Social Drivers of Health   Financial Resource Strain: Medium Risk (10/15/2020)   Received from [facility]   Overall Financial Resource Strain (CARDIA)    Difficulty of Paying Living Expenses: Somewhat hard  Food Insecurity: No Food Insecurity (10/15/2020)   Received from [facility]   Hunger Vital Sign    Within the past 12 months, you worried that your food would run out before you got the money to buy more.: Never true    Within the past 12 months, the food you bought just didn't last and you didn't have money to get more.: Never true  Transportation Needs: No Transportation Needs (10/15/2020)   Received from [facility]   PRAPARE - Transportation    Lack of Transportation (Medical): No    Lack of Transportation (Non-Medical): No  Physical Activity: Sufficiently Active (11/28/2018)   Exercise Vital Sign    Days of Exercise per Week: 6 days    Minutes of Exercise per Session: 60 min  Stress: Stress Concern Present (11/28/2018)   [employer] of Occupational Health - Occupational Stress Questionnaire    Feeling of Stress : Rather much  Social Connections: Unknown (01/12/2019)   Received from [facility]   Social Connection and Isolation Panel    Frequency of Communication with Friends and Family: Not on file    Frequency of Social Gatherings with Friends and Family: Not on file    Attends Religious Services: Not on file    Active Member of Clubs or Organizations: Not on file    Attends Banker Meetings: Not on file    Marital Status: Married    Allergies:  Allergies  Allergen Reactions   Penicillins Hives, Itching and Swelling    And itching    Ciprofloxacin Itching   Morphine Sulfate Itching   Sulfa Antibiotics Itching and Nausea And Vomiting   Tape Itching and Other (See Comments)    Welts & skin peeling off Welts & skin peeling off    Metabolic Disorder Labs: No results found for: HGBA1C, MPG No results found for: PROLACTIN No results found for: CHOL, TRIG, HDL, CHOLHDL, VLDL, LDLCALC Lab Results  Component Value Date    TSH 4.14 04/11/2024   TSH 2.271 12/30/2022    Therapeutic Level Labs: No results found for: LITHIUM No results found for: VALPROATE No results found for: CBMZ  Current Medications: Current Outpatient Medications  Medication Sig Dispense Refill   brexpiprazole  0.5 MG TABS Take 1 tablet (0.5 mg total) by mouth daily. 30 tablet 1   amitriptyline  (ELAVIL ) 150 MG tablet Take 1 tablet (150 mg total) by mouth at bedtime. 30 tablet 3   amitriptyline  (ELAVIL )  150 MG tablet Take 1 tablet (150 mg total) by mouth at bedtime. 30 tablet 5   gabapentin  (NEURONTIN ) 100 MG capsule Take 300 mg by mouth 3 (three) times daily.     lamoTRIgine  (LAMICTAL ) 200 MG tablet Take 1 tablet (200 mg total) by mouth 2 (two) times daily. 60 tablet 1   [START ON 07/21/2024] lamoTRIgine  (LAMICTAL ) 200 MG tablet Take 1 tablet (200 mg total) by mouth 2 (two) times daily. 60 tablet 5   [START ON 07/10/2024] LORazepam  (ATIVAN ) 2 MG tablet Take 1 tablet (2 mg total) by mouth daily as needed for anxiety. 30 tablet 1   prazosin  (MINIPRESS ) 1 MG capsule Take 1 capsule (1 mg total) by mouth at bedtime for 3 days. 3 capsule 0   [START ON 07/14/2024] prazosin  (MINIPRESS ) 2 MG capsule Take 2 capsules (4 mg total) by mouth at bedtime. 60 capsule 1   No current facility-administered medications for this visit.     Musculoskeletal: Strength & Muscle Tone: N/A Gait & Station: N/A Patient leans: N/A  Psychiatric Specialty Exam: Review of Systems  Psychiatric/Behavioral:  Positive for decreased concentration, dysphoric mood and sleep disturbance. Negative for agitation, behavioral problems, confusion, hallucinations, self-injury and suicidal ideas. The patient is nervous/anxious. The patient is not hyperactive.   All other systems reviewed and are negative.   There were no vitals taken for this visit.There is no height or weight on file to calculate BMI.  General Appearance: Well Groomed  Eye Contact:  Good  Speech:  Clear and  Coherent  Volume:  Normal  Mood:  Anxious and Depressed  Affect:  Appropriate, Congruent, and down  Thought Process:  Coherent  Orientation:  Full (Time, Place, and Person)  Thought Content: Logical   Suicidal Thoughts:  No  Homicidal Thoughts:  No  Memory:  Immediate;   Good  Judgement:  Good  Insight:  Good  Psychomotor Activity:  Normal  Concentration:  Concentration: Good and Attention Span: Good  Recall:  Good  Fund of Knowledge: Good  Language: Good  Akathisia:  No  Handed:  Right  AIMS (if indicated): not done  Assets:  Communication Skills Desire for Improvement  ADL's:  Intact  Cognition: WNL  Sleep:  Poor   Screenings: GAD-7    Flowsheet Row Office Visit from 12/30/2022 in [facility] Office Visit from 11/02/2022 in [facility] Office Visit from 09/09/2022 in [facility] Office Visit from 07/15/2022 in [facility] Office Visit from 06/14/2022 in [facility]  Total GAD-7 Score 12 11 12 11 21    PHQ2-9    Flowsheet Row Office Visit from 12/30/2022 in [facility] Office Visit from 11/02/2022 in [facility] Office Visit from 09/09/2022 in [facility] Office Visit from 07/15/2022 in [facility] Office Visit from 06/14/2022 in [facility]  PHQ-2 Total Score 2 2 2 2 4   PHQ-9 Total Score 6 9 10 7 14      Assessment and Plan:  Amy Leblanc is a 53 y.o. year old female with a history of depression, ADHD, insomnia, s/p gastric surgery, CKD stage 3 s/p nephrectomy, who presents for follow up appointment for below.   1. Mood disorder (HCC) [F39] 2. GAD (generalized anxiety  disorder) [F41.1] 3. Panic attack R.o MDD with mixed episode R/o  bipolar II disorder She experiences the profound loss of both parents, with whom she had a close relationship. Her son has autism. History: previously seen by Dr. Chipper. originally on lamotrigine  200 mg BID, amitriptyline  75 mg at night, Buspar  30 mg BID, lorazepam  1 mg dailyprn    She continues to experience depressive symptoms with marked anhedonia, anxiety, along with vivid images of her father, who was in a hospice care, with a sense of being traumatized.  Although the hope was that uptitrate prazosin , we will hold this at this time due to reported tachycardia.  Will add rexulti  for depression.  Discussed potential metabolic side effect, EPS and QTc prolongation.  She also agrees to pursue ketamine treatment.  The information is provided to her.  Will continue current dose of lamotrigine  for mood dysregulation.  Will continue amitriptyline  for depression and migraine.  Discussed potential risk of tachycardia.  Will continue prazosin  for PTSD related symptoms, and clonazepam as needed for anxiety.   Noted that although she carried diagnosis of bipolar disorder when she was transferred care to this provider, she reports only subthreshold hypomanic symptoms of impulsive shopping, decreased need for sleep years ago.  Since this writer has taken over the care, she has not had any episodes consistent with hypomania/mania, while lamotrigine  has been continued from the initial visit.     4. Iron deficiency - ferritin 25 04/2024 She has iron deficiency without anemia.  She takes over-the-counter vitamin supplements.    5. High risk medication use UDS negative 04/2024   # tachycardia Unchanged. She has tachycardia, up to 120's.  According to the chart review, her last EKG was taken Nov 2024 with HR 114, QTc 443 mec.  She agrees to discuss this with her primary care provider for further evaluation.    Plan - [facility] Continue lamotrigine   200 mg twice a day  Continue amitriptyline  150 mg at night  Continue prazosin  4 mg at night  Continue lorazepam  2 mg daily as needed for extreme anxiety   Contact [facility]  [phone] [email or web address] Next appointment: 10/14 at 4:30, video - on gabapentin  300 mg tid  She sees PCP through [facility]   She continues to experience significant depressive symptoms accompanied by anxiety, which have severely impacted her ability to function. Despite ongoing treatment, her mood symptoms remain debilitating. The current plan includes adjusting her medication and initiating Ketamine treatment. I support a medical leave of absence for a period of six months, which would allow her the necessary time to engage in treatment and work toward symptom improvement.  Past trials of medication: sertraline , lexapro, venlafaxine, Buspar  (limited benefit), bupropion  (limited benefit), lithium, Depakote, lamotrigine , olanzapine, Abilify, Geodon, quetiapine (weight gain), risperidone (nausea), Latuda  (weight gain) (unable to afford Vraylar )   The patient demonstrates the following risk factors for suicide: Chronic risk factors for suicide include: The. Acute risk factors for suicide include: loss (financial, interpersonal, professional). Protective factors for this patient include: positive social support and hope for the future. Considering these factors, the overall suicide risk at this point appears to be low. Patient is appropriate for outpatient follow up.   Collaboration of Care: Collaboration of Care: Other reviewed notes in Epic  Patient/Guardian was advised Release of Information must be obtained prior to any record release in order to collaborate their care with an outside provider. Patient/Guardian was advised if they have not already done so to contact the registration department to sign all necessary forms in order  for us  to release information regarding their care.    Consent: Patient/Guardian gives verbal consent for treatment and assignment of benefits for services provided during this visit. Patient/Guardian expressed understanding and agreed to proceed.    Katheren Sleet, MD 06/28/2024, 5:21 PM

## 2024-06-27 NOTE — Telephone Encounter (Signed)
 Could you resend the fax and update the patient accordingly? Thanks.

## 2024-06-28 ENCOUNTER — Telehealth (INDEPENDENT_AMBULATORY_CARE_PROVIDER_SITE_OTHER): Payer: Self-pay | Admitting: Psychiatry

## 2024-06-28 ENCOUNTER — Encounter: Payer: Self-pay | Admitting: Psychiatry

## 2024-06-28 DIAGNOSIS — F39 Unspecified mood [affective] disorder: Secondary | ICD-10-CM

## 2024-06-28 DIAGNOSIS — F411 Generalized anxiety disorder: Secondary | ICD-10-CM

## 2024-06-28 DIAGNOSIS — F410 Panic disorder [episodic paroxysmal anxiety] without agoraphobia: Secondary | ICD-10-CM

## 2024-06-28 MED ORDER — PRAZOSIN HCL 2 MG PO CAPS: 4.0000 mg | ORAL_CAPSULE | Freq: Every day | ORAL | 1 refills | Status: DC

## 2024-06-28 MED ORDER — BREXPIPRAZOLE 0.5 MG PO TABS: 0.5000 mg | ORAL_TABLET | Freq: Every day | ORAL | 1 refills | Status: AC

## 2024-06-28 MED ORDER — LAMOTRIGINE 200 MG PO TABS: 200.0000 mg | ORAL_TABLET | Freq: Two times a day (BID) | ORAL | 5 refills | Status: AC

## 2024-06-28 MED ORDER — LORAZEPAM 2 MG PO TABS: 2.0000 mg | ORAL_TABLET | Freq: Every day | ORAL | 1 refills | Status: DC | PRN

## 2024-06-28 NOTE — Patient Instructions (Addendum)
 Continue lamotrigine  200 mg twice a day  Continue amitriptyline  150 mg at night  Continue prazosin  4 mg at night  Continue lorazepam  2 mg daily as needed for extreme anxiety   Contact [facility]  [phone] [email or web address] Next appointment: 10/14 at 4:30

## 2024-06-28 NOTE — Telephone Encounter (Signed)
 This has already been done. Closing note

## 2024-07-02 ENCOUNTER — Telehealth: Payer: Self-pay | Admitting: Psychiatry

## 2024-07-02 NOTE — Telephone Encounter (Signed)
 Returned to call to patient regarding her my chart message about Rexulti  being expensive.  Patient reports she tried and failed multiple medications previously and that Dr. Vickey had discussed a particular medication with her she does not remember the name.  She would like to wait until tomorrow to talk to Dr. Vickey regarding this.  She also tried to reach out to the ketamine clinic however was unable to reach anyone.  She is going to send him an email today to get in touch.  She is going to be in therapy session tomorrow and if Dr.Hisada does call her and if she is unable to pick up the call she would like the provider to leave a voicemail with a good time for her to call back.

## 2024-07-11 ENCOUNTER — Other Ambulatory Visit: Payer: Self-pay | Admitting: Psychiatry

## 2024-07-13 ENCOUNTER — Other Ambulatory Visit: Payer: Self-pay | Admitting: Psychiatry

## 2024-08-22 NOTE — Progress Notes (Signed)
 Virtual Visit via Video Note  I connected with Amy Leblanc on 08/28/24 at  4:30 PM EDT by a video enabled telemedicine application and verified that I am speaking with the correct person using two identifiers.  Location: Patient: outside Provider: office Persons participated in the visit- patient, provider    I discussed the limitations of evaluation and management by telemedicine and the availability of in person appointments. The patient expressed understanding and agreed to proceed.    I discussed the assessment and treatment plan with the patient. The patient was provided an opportunity to ask questions and all were answered. The patient agreed with the plan and demonstrated an understanding of the instructions.   The patient was advised to call back or seek an in-person evaluation if the symptoms worsen or if the condition fails to improve as anticipated.   Katheren Sleet, MD    [facility] MD/PA/NP OP Progress Note  08/28/2024 5:14 PM Amy Leblanc  MRN:  [id number]  Chief Complaint:  Chief Complaint  Patient presents with   Follow-up   HPI:  This is a follow-up appointment for mood disorder, anxiety.  She states that she received ketamine treatment in late August through September.  She was feeling better in variety of ways. She was feeling lighter. Her husband and son also noticed the positive difference.  However, she has been feeling worse in the last week.  She has been feeling more anxious and the image of her father is back.  She feels the work is triggering.  She has some good and bad time at work.  She tends to feel mentally and emotionally exhausted.  She has started to have crying spells again.  She was informed that the ketamine clinic that she might need maintenance treatment.  She is planning to reach out to them about this.  She reports worsening in middle insomnia.  She denies change in appetite.  She denies SI, HI, hallucinations.  She states that she was feeling  manic.  She was cleaning the entire house, which lasted for a day.  She has been feeling a little more irritable. She takes lorazepam  for anxiety, although she does not take it every day.  Of note, HR reportedly approved her intermittent leave.  However, her boss did not approve this. Hs agrees to reach out to HR about this.   Visit Diagnosis:    ICD-10-CM   1. Mood disorder (HCC) [F39]  F39     2. GAD (generalized anxiety disorder) [F41.1]  F41.1     3. Panic attack  F41.0       Past Psychiatric History: Please see initial evaluation for full details. I have reviewed the history. No updates at this time.     Past Medical History:  Past Medical History:  Diagnosis Date   ADHD (attention deficit hyperactivity disorder)    Anxiety    Bipolar disorder (HCC)    Depression     Past Surgical History:  Procedure Laterality Date   ABDOMINAL HYSTERECTOMY     HERNIA REPAIR     KIDNEY SURGERY Right    KNEE ARTHROSCOPY Bilateral    WRIST SURGERY Left     Family Psychiatric History: Please see initial evaluation for full details. I have reviewed the history. No updates at this time.     Family History:  Family History  Problem Relation Age of Onset   Anxiety disorder Mother    Depression Mother    Schizophrenia Maternal Grandmother  Social History:  Social History   Socioeconomic History   Marital status: Married    Spouse name: greg   Number of children: 1   Years of education: Not on file   Highest education level: Some college, no degree  Occupational History   Not on file  Tobacco Use   Smoking status: Never   Smokeless tobacco: Never  Vaping Use   Vaping status: Never Used  Substance and Sexual Activity   Alcohol use: Not Currently   Drug use: Never   Sexual activity: Yes  Other Topics Concern   Not on file  Social History Narrative   Not on file   Social Drivers of Health   Financial Resource Strain: Medium Risk (10/15/2020)   Received [facility]   Overall Financial Resource Strain (CARDIA)    Difficulty of Paying Living Expenses: Somewhat hard  Food Insecurity: No Food Insecurity (10/15/2020)   Received [facility]   Hunger Vital Sign    Within the past 12 months, you worried that your food would run out before you got the money to buy more.: Never true    Within the past 12 months, the food you bought just didn't last and you didn't have money to get more.: Never true  Transportation Needs: No Transportation Needs (10/15/2020)   Received from [facility]   PRAPARE - Transportation    Lack of Transportation (Medical): No    Lack of Transportation (Non-Medical): No  Physical Activity: Sufficiently Active (11/28/2018)   Exercise Vital Sign    Days of Exercise per Week: 6 days    Minutes of Exercise per Session: 60 min  Stress: Stress Concern Present (11/28/2018)   [employer] of Occupational Health - Occupational Stress Questionnaire    Feeling of Stress : Rather much  Social Connections: Unknown (01/12/2019)   Received from [facility]   Social Connection and Isolation Panel    Frequency of Communication with Friends and Family: Not on file    Frequency of Social Gatherings with Friends and Family: Not on file    Attends Religious Services: Not on file    Active Member of Clubs or Organizations: Not on file    Attends Banker Meetings: Not on file    Marital Status: Married    Allergies:  Allergies  Allergen Reactions   Penicillins Hives, Itching and Swelling    And itching    Ciprofloxacin Itching   Morphine Sulfate Itching   Sulfa Antibiotics Itching and Nausea And Vomiting   Tape Itching and Other (See Comments)    Welts & skin peeling off Welts & skin peeling off    Metabolic Disorder Labs: No results found for: HGBA1C, MPG No results found for: PROLACTIN No results found for: CHOL, TRIG, HDL, CHOLHDL, VLDL, LDLCALC Lab Results  Component Value  Date   TSH 4.14 04/11/2024   TSH 2.271 12/30/2022    Therapeutic Level Labs: No results found for: LITHIUM No results found for: VALPROATE No results found for: CBMZ  Current Medications: Current Outpatient Medications  Medication Sig Dispense Refill   amitriptyline  (ELAVIL ) 150 MG tablet Take 1 tablet (150 mg total) by mouth at bedtime. 30 tablet 5   gabapentin  (NEURONTIN ) 100 MG capsule Take 300 mg by mouth 3 (three) times daily.     lamoTRIgine  (LAMICTAL ) 200 MG tablet Take 1 tablet (200 mg total) by mouth 2 (two) times daily. 60 tablet 5   [START ON 09/11/2024] LORazepam  (  ATIVAN ) 2 MG tablet Take 1 tablet (2 mg total) by mouth daily as needed for anxiety. 30 tablet 1   prazosin  (MINIPRESS ) 1 MG capsule Take 1 capsule (1 mg total) by mouth at bedtime for 3 days. 3 capsule 0   [START ON 09/12/2024] prazosin  (MINIPRESS ) 2 MG capsule Take 2 capsules (4 mg total) by mouth at bedtime. 60 capsule 5   No current facility-administered medications for this visit.     Musculoskeletal: Strength & Muscle Tone: N/A Gait & Station: N/A Patient leans: N/A  Psychiatric Specialty Exam: Review of Systems  Psychiatric/Behavioral:  Positive for dysphoric mood and sleep disturbance. Negative for agitation, behavioral problems, confusion, decreased concentration, hallucinations, self-injury and suicidal ideas. The patient is nervous/anxious. The patient is not hyperactive.   All other systems reviewed and are negative.   There were no vitals taken for this visit.There is no height or weight on file to calculate BMI.  General Appearance: Well Groomed  Eye Contact:  Good  Speech:  Clear and Coherent  Volume:  Normal  Mood:  Irritable  Affect:  Appropriate, Congruent, and calm  Thought Process:  Coherent  Orientation:  Full (Time, Place, and Person)  Thought Content: Logical   Suicidal Thoughts:  No  Homicidal Thoughts:  No  Memory:  Immediate;   Good  Judgement:  Good  Insight:  Good   Psychomotor Activity:  Normal  Concentration:  Concentration: Good and Attention Span: Good  Recall:  Good  Fund of Knowledge: Good  Language: Good  Akathisia:  No  Handed:  Right  AIMS (if indicated): not done  Assets:  Communication Skills Desire for Improvement  ADL's:  Intact  Cognition: WNL  Sleep:  Poor   Screenings: GAD-7    Flowsheet Row Office Visit from 12/30/2022 in [facility] Office Visit from 11/02/2022 in [facility] Office Visit from 09/09/2022 in [facility] Visit from 07/15/2022 in [facility] Visit from 06/14/2022 in [facility]  Total GAD-7 Score 12 11 12 11 21    PHQ2-9    Flowsheet Row Office Visit from 12/30/2022 in [facility] Visit from 11/02/2022 in [facility] Visit from 09/09/2022 in [facility] Visit from 07/15/2022 in [facility] Visit from 06/14/2022 [facility]  PHQ-2 Total Score 2 2 2 2 4   PHQ-9 Total Score 6 9 10 7 14      Assessment and Plan:  Schyler Butikofer is a 53 y.o. year old female with a history of depression, ADHD, insomnia, s/p gastric surgery, CKD stage 3 s/p nephrectomy, who presents for follow up appointment for below.   1. Mood disorder (HCC) [F39] 2. GAD (generalized anxiety disorder) [F41.1] 3. Panic attack R.o MDD with mixed episode R/o bipolar II disorder She experiences the profound loss of both parents, with whom she had a close relationship. Her son has autism. History: previously seen by Dr. Chipper. originally on lamotrigine  200 mg BID, amitriptyline  75 mg at night, Buspar  30 mg BID,  lorazepam  1 mg dailyprn     Although she had good benefit from ketamine treatment, she started to have relapsing her mood symptoms, which includes exhaustion, anxiety, along with vivid images of her father, who was in a hospice care.  She is willing to  contact them to consider re initiation of the treatment.  Will continue current medication regimen in the meantime.  Will continue lamotrigine  for mood dysregulation.  Will continue amitriptyline  for depression and migraine.  Will continue prazosin  for PTSD related symptoms, and lorazepam  as needed for anxiety.   Noted that although she carried diagnosis of bipolar disorder when she was transferred care to this provider, she reports only subthreshold hypomanic symptoms of impulsive shopping, decreased need for sleep years ago.  Since this writer has taken over the care, she has not had any episodes consistent with hypomania/mania, while lamotrigine  has been continued from the initial visit.     4. Iron deficiency - ferritin 25 04/2024 She has iron deficiency without anemia.  She takes over-the-counter vitamin supplements.    5. High risk medication use UDS negative 04/2024   # tachycardia Unchanged. She has tachycardia, up to 120's.  According to the chart review, her last EKG was taken Nov 2024 with HR 114, QTc 443 mec.  It was previously advised to  discuss this with her primary care provider for further evaluation.    Plan - [facility] Continue lamotrigine  200 mg twice a day  Continue amitriptyline  150 mg at night  Continue prazosin  4 mg at night  Continue lorazepam  2 mg daily as needed for extreme anxiety   She will reach out to Ketamine clinic to resume treatment Next appointment: 12/8 at 4:30, video - on gabapentin  300 mg tid  She sees PCP through [facility]   She continues to experience significant depressive symptoms accompanied by anxiety, which have severely impacted her ability to function. Despite ongoing treatment, her mood symptoms  remain debilitating. The current plan includes adjusting her medication and initiating Ketamine treatment. I support a medical leave of absence for a period of six months, which would allow her the necessary time to engage in treatment and work toward symptom improvement.   Past trials of medication: sertraline , lexapro, venlafaxine, Buspar  (limited benefit), bupropion  (limited benefit), lithium, Depakote, lamotrigine , olanzapine, Abilify, Geodon, quetiapine (weight gain), risperidone (nausea), Latuda  (weight gain) (unable to afford Vraylar , rexulti )   The patient demonstrates the following risk factors for suicide: Chronic risk factors for suicide include: The. Acute risk factors for suicide include: loss (financial, interpersonal, professional). Protective factors for this patient include: positive social support and hope for the future. Considering these factors, the overall suicide risk at this point appears to be low. Patient is appropriate for outpatient follow up.     Collaboration of Care: Collaboration of Care: Other reviewed notes in Epic  Patient/Guardian was advised Release of Information must be obtained prior to any record release in order to collaborate their care with an outside provider. Patient/Guardian was advised if they have not already done so to contact the registration department to sign all necessary forms in order for us  to release information regarding their care.   Consent: Patient/Guardian gives verbal consent for treatment and assignment of benefits for services provided during this visit. Patient/Guardian expressed understanding and agreed to proceed.    Katheren Sleet, MD 08/28/2024, 5:14 PM

## 2024-08-28 ENCOUNTER — Encounter: Payer: Self-pay | Admitting: Psychiatry

## 2024-08-28 ENCOUNTER — Telehealth (INDEPENDENT_AMBULATORY_CARE_PROVIDER_SITE_OTHER): Payer: Self-pay | Admitting: Psychiatry

## 2024-08-28 DIAGNOSIS — F411 Generalized anxiety disorder: Secondary | ICD-10-CM

## 2024-08-28 DIAGNOSIS — F410 Panic disorder [episodic paroxysmal anxiety] without agoraphobia: Secondary | ICD-10-CM

## 2024-08-28 DIAGNOSIS — F39 Unspecified mood [affective] disorder: Secondary | ICD-10-CM

## 2024-08-28 MED ORDER — LORAZEPAM 2 MG PO TABS: 2.0000 mg | ORAL_TABLET | Freq: Every day | ORAL | 1 refills | Status: DC | PRN

## 2024-08-28 MED ORDER — PRAZOSIN HCL 2 MG PO CAPS: 4.0000 mg | ORAL_CAPSULE | Freq: Every day | ORAL | 5 refills | Status: AC

## 2024-08-28 NOTE — Patient Instructions (Signed)
 Continue lamotrigine  200 mg twice a day  Continue amitriptyline  150 mg at night  Continue prazosin  4 mg at night  Continue lorazepam  2 mg daily as needed for extreme anxiety   Next appointment: 12/8 at 4:30

## 2024-10-16 NOTE — Progress Notes (Signed)
 Virtual Visit via Video Note  I connected with Amy Leblanc on 10/22/24 at  4:30 PM EST by a video enabled telemedicine application and verified that I am speaking with the correct person using two identifiers.  Location: Patient: home Provider: office Persons participated in the visit- patient, provider    I discussed the limitations of evaluation and management by telemedicine and the availability of in person appointments. The patient expressed understanding and agreed to proceed.    I discussed the assessment and treatment plan with the patient. The patient was provided an opportunity to ask questions and all were answered. The patient agreed with the plan and demonstrated an understanding of the instructions.   The patient was advised to call back or seek an in-person evaluation if the symptoms worsen or if the condition fails to improve as anticipated.   Katheren Sleet, MD    [facility] MD/PA/NP OP Progress Note  10/22/2024 5:07 PM Amy Leblanc  MRN:  [id number]  Chief Complaint:  Chief Complaint  Patient presents with   Follow-up   HPI:  - since the last visit, she presented to ED after a fall.   This is a follow-up appointment for mood disorder, anxiety.  She states that she has been doing better, although she still has some good days and bad days.  She is still working, but is considering to switch to prn, working every 3rd weekends. She has started pilates. She is involved in a church, doing tourist information centre manager.  She states that it is not easy as she has social anxiety.  She feels awkward and dumb as she does not know what to talk about.  She had a few ketamine treatment since completed the course.  It has been helping for depression and anxiety.  She had an episode of her feeling energized for several days.  She was pulling everything out of the closet.  She states that this is a way of coping as well.  She did some impulsive shopping, buying clothes. She also reports racing thoughts.   She thinks about stupid things over and over.  She shares an episode of her not being able to stop wrapping a present despite her feeling tired.  She has middle insomnia.  She denies change in appetite.  She denies SI, HI, hallucinations.  She agrees with the plans as outlined below.   Physical-she is planning to see her primary care provider about her physical concern. She has some tremors, and scribble in writing. She has a family history of her mother with parkinson disease.    09/25/24 0937  BP: 132/76  Pulse: 91   Weight 68 kg (150 lb) 09/13/2024 4:16 PM EDT   Wt Readings from Last 3 Encounters:  12/30/22 152 lb 6.4 oz (69.1 kg)  11/02/22 159 lb (72.1 kg)  09/09/22 152 lb 12.8 oz (69.3 kg)      Visit Diagnosis:    ICD-10-CM   1. Mood disorder (HCC) [F39]  F39     2. GAD (generalized anxiety disorder) [F41.1]  F41.1     3. Panic attack  F41.0       Past Psychiatric History: Please see initial evaluation for full details. I have reviewed the history. No updates at this time.     Past Medical History:  Past Medical History:  Diagnosis Date   ADHD (attention deficit hyperactivity disorder)    Anxiety    Bipolar disorder (HCC)    Depression     Past Surgical History:  Procedure  Laterality Date   ABDOMINAL HYSTERECTOMY     HERNIA REPAIR     KIDNEY SURGERY Right    KNEE ARTHROSCOPY Bilateral    WRIST SURGERY Left     Family Psychiatric History: Please see initial evaluation for full details. I have reviewed the history. No updates at this time.    Family History:  Family History  Problem Relation Age of Onset   Anxiety disorder Mother    Depression Mother    Schizophrenia Maternal Grandmother     Social History:  Social History   Socioeconomic History   Marital status: Married    Spouse name: greg   Number of children: 1   Years of education: Not on file   Highest education level: Some college, no degree  Occupational History   Not on file   Tobacco Use   Smoking status: Never   Smokeless tobacco: Never  Vaping Use   Vaping status: Never Used  Substance and Sexual Activity   Alcohol use: Not Currently   Drug use: Never   Sexual activity: Yes  Other Topics Concern   Not on file  Social History Narrative   Not on file   Social Drivers of Health   Financial Resource Strain: Medium Risk (10/15/2020)   Received [facility]   Overall Financial Resource Strain (CARDIA)    Difficulty of Paying Living Expenses: Somewhat hard  Food Insecurity: No Food Insecurity (10/15/2020)   Received from [facility]   Hunger Vital Sign    Within the past 12 months, you worried that your food would run out before you got the money to buy more.: Never true    Within the past 12 months, the food you bought just didn't last and you didn't have money to get more.: Never true  Transportation Needs: No Transportation Needs (10/15/2020)   Received from [facility]   PRAPARE - Transportation    Lack of Transportation (Medical): No    Lack of Transportation (Non-Medical): No  Physical Activity: Sufficiently Active (11/28/2018)   Exercise Vital Sign    Days of Exercise per Week: 6 days    Minutes of Exercise per Session: 60 min  Stress: Stress Concern Present (11/28/2018)   [employer] of Occupational Health - Occupational Stress Questionnaire    Feeling of Stress : Rather much  Social Connections: Unknown (01/12/2019)   Received from [facility]   Social Connection and Isolation Panel    Frequency of Communication with Friends and Family: Not on file    Frequency of Social Gatherings with Friends and Family: Not on file    Attends Religious Services: Not on file    Active Member of Clubs or Organizations: Not on file    Attends Banker Meetings: Not on file    Marital Status: Married    Allergies:  Allergies  Allergen Reactions   Penicillins Hives, Itching and Swelling    And itching     Ciprofloxacin Itching   Morphine Sulfate Itching   Sulfa Antibiotics Itching and Nausea And Vomiting   Tape Itching and Other (See Comments)    Welts & skin peeling off Welts & skin peeling off    Metabolic Disorder Labs: No results found for: HGBA1C, MPG No results found for: PROLACTIN No results found for: CHOL, TRIG, HDL, CHOLHDL, VLDL, LDLCALC Lab Results  Component Value Date   TSH 4.14 04/11/2024   TSH 2.271 12/30/2022    Therapeutic Level Labs: No results found  for: LITHIUM No results found for: VALPROATE No results found for: CBMZ  Current Medications: Current Outpatient Medications  Medication Sig Dispense Refill   amitriptyline  (ELAVIL ) 150 MG tablet Take 1 tablet (150 mg total) by mouth at bedtime. 30 tablet 5   gabapentin  (NEURONTIN ) 100 MG capsule Take 300 mg by mouth 3 (three) times daily.     lamoTRIgine  (LAMICTAL ) 200 MG tablet Take 1 tablet (200 mg total) by mouth 2 (two) times daily. 60 tablet 5   [START ON 11/17/2024] LORazepam  (ATIVAN ) 2 MG tablet Take 1 tablet (2 mg total) by mouth daily as needed for anxiety. 30 tablet 0   prazosin  (MINIPRESS ) 1 MG capsule Take 1 capsule (1 mg total) by mouth at bedtime for 3 days. 3 capsule 0   prazosin  (MINIPRESS ) 2 MG capsule Take 2 capsules (4 mg total) by mouth at bedtime. 60 capsule 5   No current facility-administered medications for this visit.     Musculoskeletal: Strength & Muscle Tone: N/A Gait & Station: N/A Patient leans: N/A  Psychiatric Specialty Exam: Review of Systems  Psychiatric/Behavioral:  Positive for dysphoric mood and sleep disturbance. Negative for agitation, behavioral problems, confusion, decreased concentration, hallucinations, self-injury and suicidal ideas. The patient is nervous/anxious. The patient is not hyperactive.   All other systems reviewed and are negative.   There were no vitals taken for this visit.There is no height or weight on file to calculate BMI.   General Appearance: Well Groomed  Eye Contact:  Good  Speech:  Clear and Coherent  Volume:  Normal  Mood:  good  Affect:  Appropriate, Congruent, and calm  Thought Process:  Coherent  Orientation:  Full (Time, Place, and Person)  Thought Content: Logical   Suicidal Thoughts:  No  Homicidal Thoughts:  No  Memory:  Immediate;   Good  Judgement:  Good  Insight:  Good  Psychomotor Activity:  Normal  Concentration:  Concentration: Good and Attention Span: Good  Recall:  Good  Fund of Knowledge: Good  Language: Good  Akathisia:  No  Handed:  Right  AIMS (if indicated): not done  Assets:  Communication Skills Desire for Improvement  ADL's:  Intact  Cognition: WNL  Sleep:  Poor   Screenings: GAD-7    Flowsheet Row Office Visit from 12/30/2022 in [facility] Office Visit from 11/02/2022 in [facility] Office Visit from 09/09/2022 in [facility] Office Visit from 07/15/2022 in [facility] Office Visit from 06/14/2022 in [facility]  Total GAD-7 Score 12 11 12 11 21    PHQ2-9    Flowsheet Row Office Visit from 12/30/2022 in [facility] Office Visit from 11/02/2022 in [facility] Office Visit from 09/09/2022 in [facility] Office Visit from 07/15/2022 in [facility] Office Visit from 06/14/2022 in [facility]  PHQ-2 Total Score 2 2 2 2 4   PHQ-9 Total Score 6 9 10 7 14      Assessment and Plan:  Amy Leblanc is a 53 y.o. female with a history of depression, ADHD, insomnia, s/p gastric surgery, CKD stage 3 s/p nephrectomy, who presents for follow up appointment for below.   1. Mood  disorder (HCC) [F39] 2. GAD (generalized anxiety disorder) [F41.1] 3. Panic attack R.o MDD with mixed episode R/o bipolar II disorder # r/o  OCD She experiences the profound loss of both parents, with whom she had a close relationship. Her son has autism. History: previously seen by Dr. Chipper. originally on lamotrigine  200 mg BID, amitriptyline  75 mg at night, Buspar  30 mg BID, lorazepam  1 mg dailyprn      Exam is notable for calmer affect.  She reports overall improvement in her mood symptoms since starting ketamine treatment.  She is considering to work as as needed.  Noted that she had a few episodes of subthreshold hypomanic symptoms of increased energy, impulsive shopping, although some of which could be attributable to manic defense. She also has some OCD traits.  While she may benefit from antipsychotics such as lumateperone, there is a concern of coordination in her hands.  Will hold starting any antipsychotics at this time while intervening insomnia as outlined below.  Will continue lamotrigine  for mood dysregulation.  Will continue amitriptyline  for depression and migraine.  Will continue prazosin  and olanzapine as needed for anxiety.   # Insomnia She reports middle insomnia.  She denies any need for pharmacological treatment, and is willing to work on sleep hygiene.  Continue to monitor and intervene as needed.   4. Iron deficiency - ferritin 25 04/2024 She has iron deficiency without anemia.  She takes over-the-counter vitamin supplements.    5. High risk medication use UDS negative 04/2024   Plan - [facility] Continue lamotrigine  200 mg twice a day  Continue amitriptyline  150 mg at night  Continue prazosin  4 mg at night  Continue lorazepam  1-2 mg daily as needed for extreme anxiety   Next appointment: 12/8 at 4:30, video - on gabapentin  300 mg tid  She sees PCP through [facility] - EKG HR 90, QTc 430 msec   She continues to experience significant depressive symptoms  accompanied by anxiety, which have severely impacted her ability to function. Despite ongoing treatment, her mood symptoms remain debilitating. The current plan includes adjusting her medication and initiating Ketamine treatment. I support a medical leave of absence for a period of six months, which would allow her the necessary time to engage in treatment and work toward symptom improvement.   Past trials of medication: sertraline , lexapro, venlafaxine, Buspar  (limited benefit), bupropion  (limited benefit), lithium, Depakote, lamotrigine , olanzapine, Abilify, Geodon, quetiapine (weight gain), risperidone (nausea), Latuda  (weight gain) (unable to afford Vraylar , rexulti )   The patient demonstrates the following risk factors for suicide: Chronic risk factors for suicide include: The. Acute risk factors for suicide include: loss (financial, interpersonal, professional). Protective factors for this patient include: positive social support and hope for the future. Considering these factors, the overall suicide risk at this point appears to be low. Patient is appropriate for outpatient follow up.     Collaboration of Care: Collaboration of Care: Other reviewed notes in Epic  Patient/Guardian was advised Release of Information must be obtained prior to any record release in order to collaborate their care with an outside provider. Patient/Guardian was advised if they have not already done so to contact the registration department to sign all necessary forms in order for us  to release information regarding their care.   Consent: Patient/Guardian gives verbal consent for treatment and assignment of benefits for services provided during this visit. Patient/Guardian expressed understanding and agreed to proceed.    Katheren Sleet, MD 10/22/2024, 5:07 PM

## 2024-10-22 ENCOUNTER — Telehealth: Payer: Self-pay | Admitting: Psychiatry

## 2024-10-22 ENCOUNTER — Encounter: Payer: Self-pay | Admitting: Psychiatry

## 2024-10-22 DIAGNOSIS — F410 Panic disorder [episodic paroxysmal anxiety] without agoraphobia: Secondary | ICD-10-CM

## 2024-10-22 DIAGNOSIS — F39 Unspecified mood [affective] disorder: Secondary | ICD-10-CM

## 2024-10-22 DIAGNOSIS — F411 Generalized anxiety disorder: Secondary | ICD-10-CM

## 2024-10-22 MED ORDER — LORAZEPAM 2 MG PO TABS: 2.0000 mg | ORAL_TABLET | Freq: Every day | ORAL | 0 refills | Status: DC | PRN

## 2024-10-23 NOTE — Progress Notes (Signed)
 DERMATOLOGY CLINIC NOTE  ASSESSMENT AND PLAN:   Benign lesions (seborrheic keratoses, nevi, lentigines): -Reassurance provided. -Discussed good photoprotective practices.  Recommended sunscreen with SPF. -Discussed make us  aware should lesions grow or change significantly and also discussed other worrisome findings such as bleeding.  Actinic Keratoses:  - After R/B/A discussed (including scarring, pigment alteration, recurrence, or persistence of the lesion) and verbal consent was obtained, identified  lesions were treated with cryotherapy x 1 ten second freeze-thaw cycle. The patient tolerated the procedure well and was instructed on post-cryotherapy wound care.  Total AK's frozen:1 Location and number: Left forehead   Return to clinic: 6 months or sooner as needed    CHIEF COMPLAINT: Skin check in the context of a history of nonmelanoma skin cancer   HPI:  This is a pleasant 53 y.o. female who last saw me on 05/04/2024.  At that time we shaved and treated 2 keratoacanthoma's on her legs.  She has a personal history of nonmelanoma skin cancer.  Today she comes in for a 31-month exam.  She has no specific concerns today.   PAST MEDICAL HISTORY:  Diagnosis  Location  Biopsy Date  Treatment date  Procedure  Surgeon   [facility] L thigh   11/18 ED&C    SCC Left arm 03/2017 04/2017 Excision [facility] Left Shin 06/2015 07/2015 excision [facility]  Squamous cell carcinoma  Right shin    06/2011  excision     [facility] R mid back 12/2013 01/2014 ED&C    [facility] R upper chest 12/2013 01/2014 excision    s[facility]CC L thigh   11/2022 Ed&C    sBCC L leg   11/2022 ED&C    Bipolar Chronic pain Migraine Depression Substance abuse   MEDICATIONS:  Medications Ordered Prior to Encounter[1]  ALLERGIES:  Reviewed in epic  SOCIAL HISTORY: Lives in [place]  REVIEW OF SYSTEMS: Baseline state of health. No recent illnesses. No other skin complaints.   PHYSICAL EXAMINATION: Examination in the presence of  chaperone: General: Well-developed, well-nourished. No acute distress.  Neuro: Alert and oriented, answers questions appropriately. Skin: Examination of the scalp, face, head, neck, chest, back, upper extremities, lower extremities, hands, palms, soles was performed and notable for the following: Thin scaly macule on left forehead Scattered lentigines Scattered well-healed scars Moderate photo actinic change   Dictation software was used while making this note. Please excuse any errors made with dictation software.      [1] Current Outpatient Medications on File Prior to Visit  Medication Sig Dispense Refill   acetaminophen (TYLENOL) 500 MG tablet Take 2 tablets (1,000 mg total) by mouth every eight (8) hours. 30 tablet 0   amitriptyline  (ELAVIL ) 150 MG tablet Take 1 tablet (150 mg total) by mouth.     cyclobenzaprine (FLEXERIL) 10 MG tablet Take 1 tablet (10 mg total) by mouth two (2) times a day as needed. 20 tablet 0   diazePAM (VALIUM) 5 MG tablet      diphenhydrAMINE (BENADRYL) 25 mg tablet Take 1 tablet (25 mg total) by mouth every six (6) hours as needed. 30 each 0   gabapentin  (NEURONTIN ) 300 MG capsule TAKE 1 CAPSULE(300 MG) BY MOUTH THREE TIMES DAILY 270 capsule 0   lamoTRIgine  (LAMICTAL ) 200 MG tablet Take 1 tablet (200 mg total) by mouth two (2) times a day.     LORazepam  (ATIVAN ) 2 MG tablet Take 1 tablet (2 mg total) by mouth.     ondansetron (ZOFRAN-ODT) 8 MG disintegrating tablet Take  1 tablet (8 mg total) by mouth every eight (8) hours as needed for nausea. 90 tablet 11   oxybutynin (DITROPAN-XL) 10 MG 24 hr tablet      [EXPIRED] oxyCODONE (ROXICODONE) 5 MG immediate release tablet Take 1 tablet (5 mg total) by mouth every four (4) hours as needed for pain for up to 5 days. 10 tablet 0   prazosin  (MINIPRESS ) 2 MG capsule Take 1 capsule (2 mg total) by mouth nightly.     rizatriptan (MAXALT) 10 MG tablet Take 1 tablet (10 mg total) by mouth once as needed.      No current facility-administered medications on file prior to visit.
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
## 2024-11-27 NOTE — Progress Notes (Unsigned)
 Virtual Visit via Video Note  I connected with Amy Leblanc on 11/28/2024 at 11:30 AM EST by a video enabled telemedicine application and verified that I am speaking with the correct person using two identifiers.  Location: Patient: car Provider: office Persons participated in the visit- patient, provider    I discussed the limitations of evaluation and management by telemedicine and the availability of in person appointments. The patient expressed understanding and agreed to proceed.     I discussed the assessment and treatment plan with the patient. The patient was provided an opportunity to ask questions and all were answered. The patient agreed with the plan and demonstrated an understanding of the instructions.   The patient was advised to call back or seek an in-person evaluation if the symptoms worsen or if the condition fails to improve as anticipated.    Katheren Sleet, MD    [facility] MD/PA/NP OP Progress Note  11/28/2024 12:13 PM Amy Leblanc  MRN:  [id number]  Chief Complaint:  Chief Complaint  Patient presents with   Follow-up   HPI:  - chart reviewed. She was seen by [facility]. She was recommended for repeat EGD with dilation.   This is a follow-up appointment for mood disorder, anxiety.  This appointment is made urgently due to the patient concern.  She states that she feels manic.  She is doing cleaning, and working on the closet continuously.  She will do things until she wears herself out.  She has been buying stupid stuff.  There has been too much of things in the closet including shoes.  She has been trying to arrange it.  She thinks it is largely secondary to upcoming anniversary of her father.  She feels sad and she wants him back.  Although she does not dwell on it, she thinks about him all the time.  She acknowledges that she has good memories with him.  She wanted to have more memory as she was very close to him.  She is hoping to do things more for the church.  She  reports significant difficulty in focus.  She starts doing things but not finishing even her thoughts.  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  Although she loves to work and the hospital, she has flashback.  She denies SI, hallucinations.  She denies decreased need for sleep.  She denies alcohol use or drug use.  She continues to use lorazepam  for anxiety.  She would like to do something about her mood and has agreed with the plans as outlined below.   Visit Diagnosis:    ICD-10-CM   1. Mood disorder (HCC) [F39]  F39     2. GAD (generalized anxiety disorder) [F41.1]  F41.1     3. Panic attack  F41.0       Past Psychiatric History: Please see initial evaluation for full details. I have reviewed the history. No updates at this time.     Past Medical History:  Past Medical History:  Diagnosis Date   ADHD (attention deficit hyperactivity disorder)    Anxiety    Bipolar disorder (HCC)    Depression     Past Surgical History:  Procedure Laterality Date   ABDOMINAL HYSTERECTOMY     HERNIA REPAIR     KIDNEY SURGERY Right    KNEE ARTHROSCOPY Bilateral    WRIST SURGERY Left     Family Psychiatric History: Please see initial evaluation for full  details. I have reviewed the history. No updates at this time.     Family History:  Family History  Problem Relation Age of Onset   Anxiety disorder Mother    Depression Mother    Schizophrenia Maternal Grandmother     Social History:  Social History   Socioeconomic History   Marital status: Married    Spouse name: greg   Number of children: 1   Years of education: Not on file   Highest education level: Some college, no degree  Occupational History   Not on file  Tobacco Use   Smoking status: Never   Smokeless tobacco: Never  Vaping Use   Vaping status: Never Used  Substance and Sexual Activity   Alcohol use: Not Currently   Drug use: Never   Sexual activity: Yes  Other  Topics Concern   Not on file  Social History Narrative   Not on file   Social Drivers of Health   Tobacco Use: Low Risk (11/28/2024)   Patient History    Smoking Tobacco Use: Never    Smokeless Tobacco Use: Never    Passive Exposure: Not on file  Financial Resource Strain: Not on file  Food Insecurity: Not on file  Transportation Needs: Not on file  Physical Activity: Not on file  Stress: Not on file  Social Connections: Not on file  Depression (EYV7-0): Medium Risk (12/30/2022)   Depression (PHQ2-9)    PHQ-2 Score: 6  Alcohol Screen: Not on file  Housing: Not on file  Utilities: Not on file  Health Literacy: Not on file    Allergies: Allergies[1]  Metabolic Disorder Labs: No results found for: HGBA1C, MPG No results found for: PROLACTIN No results found for: CHOL, TRIG, HDL, CHOLHDL, VLDL, LDLCALC Lab Results  Component Value Date   TSH 4.14 04/11/2024   TSH 2.271 12/30/2022    Therapeutic Level Labs: No results found for: LITHIUM No results found for: VALPROATE No results found for: CBMZ  Current Medications: Current Outpatient Medications  Medication Sig Dispense Refill   ARIPiprazole  (ABILIFY ) 2 MG tablet Take 1 tablet (2 mg total) by mouth at bedtime. 30 tablet 1   amitriptyline  (ELAVIL ) 150 MG tablet Take 1 tablet (150 mg total) by mouth at bedtime. 30 tablet 5   gabapentin  (NEURONTIN ) 100 MG capsule Take 300 mg by mouth 3 (three) times daily.     lamoTRIgine  (LAMICTAL ) 200 MG tablet Take 1 tablet (200 mg total) by mouth 2 (two) times daily. 60 tablet 5   LORazepam  (ATIVAN ) 2 MG tablet Take 1 tablet (2 mg total) by mouth daily as needed for anxiety. 30 tablet 0   prazosin  (MINIPRESS ) 2 MG capsule Take 2 capsules (4 mg total) by mouth at bedtime. 60 capsule 5   No current facility-administered medications for this visit.     Musculoskeletal: Strength & Muscle Tone: N/A Gait & Station: N/A Patient leans: N/A  Psychiatric  Specialty Exam: Review of Systems  Psychiatric/Behavioral:  Positive for decreased concentration, dysphoric mood and sleep disturbance. Negative for agitation, behavioral problems, confusion, hallucinations, self-injury and suicidal ideas. The patient is nervous/anxious. The patient is not hyperactive.   All other systems reviewed and are negative.   There were no vitals taken for this visit.There is no height or weight on file to calculate BMI.  General Appearance: Well Groomed  Eye Contact:  Good  Speech:  Clear and Coherent  Volume:  Normal  Mood:  Anxious and Depressed  Affect:  Appropriate, Congruent, and  calm  Thought Process:  Coherent  Orientation:  Full (Time, Place, and Person)  Thought Content: Logical   Suicidal Thoughts:  No  Homicidal Thoughts:  No  Memory:  Immediate;   Good  Judgement:  Good  Insight:  Good  Psychomotor Activity:  Normal  Concentration:  Concentration: Good and Attention Span: Good  Recall:  Good  Fund of Knowledge: Good  Language: Good  Akathisia:  No  Handed:  Right  AIMS (if indicated): not done  Assets:  Communication Skills Desire for Improvement  ADL's:  Intact  Cognition: WNL  Sleep:  Poor   Screenings: GAD-7    Flowsheet Row Office Visit from 12/30/2022 in [facility] Office Visit from 11/02/2022 in [facility] Office Visit from 09/09/2022 in [facility] Office Visit from 07[facility]15/2022 in [facility] Office Visit from 06/14/2022 in [facility]  Total GAD-7 Score 12 11 12 11 21    PHQ2-9    Flowsheet Row Office Visit from 12/30/2022 in [facility] Office Visit from 11/02/2022 in [facility] Office Visit from 09/09/2022 in [facility] Office Visit from 07/15/2022 in [facility] Office Visit from 06/14/2022 in [facility]  PHQ-2 Total Score 2 2 2 2 4   PHQ-9 Total Score 6 9 10 7 14      Assessment and Plan:  Amy Leblanc is a 54 y.o. female with a history of depression, ADHD, insomnia, s/p gastric surgery, para-esophageal hernia type IV s/p repair w/ Nissen in 2015, CKD stage 3 s/p nephrectomy, who presents for follow up appointment for below.   1. Mood disorder (HCC) [F39] 2. GAD (generalized anxiety disorder) [F41.1] 3. Panic attack R.o MDD with mixed episode R/o bipolar II disorder # r/o OCD She experiences the profound loss of both parents, with whom she had a close relationship. Her son has autism. History: previously seen by Dr. Chipper. originally on lamotrigine  200 mg BID, amitriptyline  75 mg at night, Buspar  30 mg BID, lorazepam  1 mg dailyprn   She reports worsening in depressive symptoms, subthreshold hypomanic symptoms of increase in energy, goal-directed activities of shopping and in the context of upcoming anniversary of loss of her father.  Her episode appears to be related to hypomania, and manic defense.  Will start Abilify  to target this.  This could be an also good option as she has OCD traits.  Discussed potential metabolic side effect, EPS and QTc prolongation.  Will continue lamotrigine  for mood dysregulation.  Will continue amitriptyline  for depression and a migraine.  Will continue prazosin  for hyperarousal symptoms/nightmares and clonazepam as needed for anxiety.   4. Iron deficiency - ferritin 25 04/2024 She has iron deficiency without anemia.  She takes over-the-counter vitamin supplements.    5. High risk medication use UDS negative 04/2024   Plan - [facility] Continue lamotrigine  200 mg twice a day  Continue amitriptyline  150 mg at night  Continue prazosin  4 mg at night  Start Abilify  2 mg at  night  Continue lorazepam  1-2 mg daily as needed for extreme anxiety   Next appointment: 2/20 at 10 am, video - on gabapentin  300 mg tid  She sees PCP through [facility] - EKG HR 90, QTc 430 msec 09/2024    Past trials of medication: sertraline , lexapro, venlafaxine, Buspar  (limited benefit),  bupropion  (limited benefit), lithium, Depakote, lamotrigine , olanzapine, Abilify , Geodon, quetiapine (weight gain), risperidone (nausea), Latuda  (weight gain) (unable to afford Vraylar , rexulti )   The patient demonstrates the following risk factors for suicide: Chronic risk factors for suicide include: The. Acute risk factors for suicide include: loss (financial, interpersonal, professional). Protective factors for this patient include: positive social support and hope for the future. Considering these factors, the overall suicide risk at this point appears to be low. Patient is appropriate for outpatient follow up.       Collaboration of Care: Collaboration of Care: Other reviewed notes in Epic  Patient/Guardian was advised Release of Information must be obtained prior to any record release in order to collaborate their care with an outside provider. Patient/Guardian was advised if they have not already done so to contact the registration department to sign all necessary forms in order for us  to release information regarding their care.   Consent: Patient/Guardian gives verbal consent for treatment and assignment of benefits for services provided during this visit. Patient/Guardian expressed understanding and agreed to proceed.    Katheren Sleet, MD 11/28/2024, 12:13 PM     [1]  Allergies Allergen Reactions   Penicillins Hives, Itching and Swelling    And itching    Ciprofloxacin Itching   Morphine Sulfate Itching   Sulfa Antibiotics Itching and Nausea And Vomiting   Tape Itching and Other (See Comments)    Welts & skin peeling off Welts & skin peeling off

## 2024-11-28 ENCOUNTER — Encounter: Payer: Self-pay | Admitting: Psychiatry

## 2024-11-28 ENCOUNTER — Telehealth: Payer: Self-pay | Admitting: Psychiatry

## 2024-11-28 DIAGNOSIS — F39 Unspecified mood [affective] disorder: Secondary | ICD-10-CM

## 2024-11-28 DIAGNOSIS — F411 Generalized anxiety disorder: Secondary | ICD-10-CM

## 2024-11-28 DIAGNOSIS — F410 Panic disorder [episodic paroxysmal anxiety] without agoraphobia: Secondary | ICD-10-CM

## 2024-11-28 MED ORDER — LORAZEPAM 2 MG PO TABS: 1.0000 mg | ORAL_TABLET | Freq: Every day | ORAL | 0 refills | Status: AC | PRN

## 2024-11-28 MED ORDER — AMITRIPTYLINE HCL 150 MG PO TABS: 150.0000 mg | ORAL_TABLET | Freq: Every day | ORAL | 5 refills | Status: AC

## 2024-11-28 MED ORDER — ARIPIPRAZOLE 2 MG PO TABS: 2.0000 mg | ORAL_TABLET | Freq: Every day | ORAL | 1 refills | Status: AC

## 2024-11-28 NOTE — Patient Instructions (Signed)
 Continue lamotrigine  200 mg twice a day  Continue amitriptyline  150 mg at night  Continue prazosin  4 mg at night  Start Abilify  2 mg at night  Continue lorazepam  1-2 mg daily as needed for extreme anxiety   Next appointment: 2/20 at 10 am

## 2024-12-12 ENCOUNTER — Telehealth: Payer: Self-pay | Admitting: Psychiatry

## 2025-01-04 ENCOUNTER — Telehealth: Payer: Self-pay | Admitting: Psychiatry
